# Patient Record
Sex: Male | Born: 1935 | Race: White | Hispanic: No | Smoking: Never smoker
Health system: Southern US, Community
[De-identification: ages and names within clinical notes are randomized; demographics above are authoritative.]

## PROBLEM LIST (undated history)

## (undated) DIAGNOSIS — G44209 Tension-type headache, unspecified, not intractable: Secondary | ICD-10-CM

## (undated) DIAGNOSIS — R634 Abnormal weight loss: Secondary | ICD-10-CM

## (undated) DIAGNOSIS — M25519 Pain in unspecified shoulder: Secondary | ICD-10-CM

## (undated) DIAGNOSIS — E785 Hyperlipidemia, unspecified: Secondary | ICD-10-CM

## (undated) DIAGNOSIS — H9190 Unspecified hearing loss, unspecified ear: Secondary | ICD-10-CM

## (undated) DIAGNOSIS — E550 Rickets, active: Secondary | ICD-10-CM

## (undated) DIAGNOSIS — G4700 Insomnia, unspecified: Secondary | ICD-10-CM

## (undated) DIAGNOSIS — I6529 Occlusion and stenosis of unspecified carotid artery: Secondary | ICD-10-CM

## (undated) DIAGNOSIS — R351 Nocturia: Secondary | ICD-10-CM

## (undated) DIAGNOSIS — I872 Venous insufficiency (chronic) (peripheral): Secondary | ICD-10-CM

## (undated) DIAGNOSIS — M1990 Unspecified osteoarthritis, unspecified site: Secondary | ICD-10-CM

## (undated) DIAGNOSIS — K449 Diaphragmatic hernia without obstruction or gangrene: Secondary | ICD-10-CM

## (undated) DIAGNOSIS — R609 Edema, unspecified: Secondary | ICD-10-CM

## (undated) DIAGNOSIS — M4120 Other idiopathic scoliosis, site unspecified: Secondary | ICD-10-CM

## (undated) DIAGNOSIS — G311 Senile degeneration of brain, not elsewhere classified: Secondary | ICD-10-CM

## (undated) DIAGNOSIS — J301 Allergic rhinitis due to pollen: Secondary | ICD-10-CM

## (undated) DIAGNOSIS — M810 Age-related osteoporosis without current pathological fracture: Secondary | ICD-10-CM

## (undated) DIAGNOSIS — G3101 Pick's disease: Secondary | ICD-10-CM

## (undated) DIAGNOSIS — E039 Hypothyroidism, unspecified: Secondary | ICD-10-CM

## (undated) DIAGNOSIS — K5900 Constipation, unspecified: Secondary | ICD-10-CM

## (undated) DIAGNOSIS — IMO0002 Reserved for concepts with insufficient information to code with codable children: Secondary | ICD-10-CM

## (undated) DIAGNOSIS — K5730 Diverticulosis of large intestine without perforation or abscess without bleeding: Secondary | ICD-10-CM

## (undated) DIAGNOSIS — R4701 Aphasia: Secondary | ICD-10-CM

## (undated) DIAGNOSIS — F0280 Dementia in other diseases classified elsewhere without behavioral disturbance: Secondary | ICD-10-CM

## (undated) DIAGNOSIS — N400 Enlarged prostate without lower urinary tract symptoms: Secondary | ICD-10-CM

## (undated) DIAGNOSIS — R569 Unspecified convulsions: Principal | ICD-10-CM

## (undated) DIAGNOSIS — K4090 Unilateral inguinal hernia, without obstruction or gangrene, not specified as recurrent: Secondary | ICD-10-CM

## (undated) DIAGNOSIS — F09 Unspecified mental disorder due to known physiological condition: Secondary | ICD-10-CM

## (undated) DIAGNOSIS — R413 Other amnesia: Secondary | ICD-10-CM

## (undated) DIAGNOSIS — F329 Major depressive disorder, single episode, unspecified: Secondary | ICD-10-CM

## (undated) DIAGNOSIS — I10 Essential (primary) hypertension: Secondary | ICD-10-CM

## (undated) HISTORY — DX: Pick's disease: G31.01

## (undated) HISTORY — DX: Unspecified hearing loss, unspecified ear: H91.90

## (undated) HISTORY — DX: Essential (primary) hypertension: I10

## (undated) HISTORY — DX: Venous insufficiency (chronic) (peripheral): I87.2

## (undated) HISTORY — DX: Rickets, active: E55.0

## (undated) HISTORY — DX: Aphasia: R47.01

## (undated) HISTORY — DX: Abnormal weight loss: R63.4

## (undated) HISTORY — DX: Unilateral inguinal hernia, without obstruction or gangrene, not specified as recurrent: K40.90

## (undated) HISTORY — DX: Hyperlipidemia, unspecified: E78.5

## (undated) HISTORY — DX: Other amnesia: R41.3

## (undated) HISTORY — DX: Other idiopathic scoliosis, site unspecified: M41.20

## (undated) HISTORY — DX: Reserved for concepts with insufficient information to code with codable children: IMO0002

## (undated) HISTORY — DX: Diverticulosis of large intestine without perforation or abscess without bleeding: K57.30

## (undated) HISTORY — DX: Unspecified convulsions: R56.9

## (undated) HISTORY — DX: Dementia in other diseases classified elsewhere without behavioral disturbance: F02.80

## (undated) HISTORY — DX: Nocturia: R35.1

## (undated) HISTORY — DX: Pain in unspecified shoulder: M25.519

## (undated) HISTORY — DX: Benign prostatic hyperplasia without lower urinary tract symptoms: N40.0

## (undated) HISTORY — DX: Insomnia, unspecified: G47.00

## (undated) HISTORY — DX: Constipation, unspecified: K59.00

## (undated) HISTORY — DX: Allergic rhinitis due to pollen: J30.1

## (undated) HISTORY — DX: Major depressive disorder, single episode, unspecified: F32.9

## (undated) HISTORY — DX: Hypothyroidism, unspecified: E03.9

## (undated) HISTORY — DX: Unspecified mental disorder due to known physiological condition: F09

## (undated) HISTORY — DX: Senile degeneration of brain, not elsewhere classified: G31.1

## (undated) HISTORY — DX: Occlusion and stenosis of unspecified carotid artery: I65.29

## (undated) HISTORY — DX: Age-related osteoporosis without current pathological fracture: M81.0

## (undated) HISTORY — DX: Edema, unspecified: R60.9

## (undated) HISTORY — DX: Unspecified osteoarthritis, unspecified site: M19.90

## (undated) HISTORY — DX: Diaphragmatic hernia without obstruction or gangrene: K44.9

## (undated) HISTORY — DX: Tension-type headache, unspecified, not intractable: G44.209

---
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
## 1999-09-21 ENCOUNTER — Encounter (INDEPENDENT_AMBULATORY_CARE_PROVIDER_SITE_OTHER): Payer: Self-pay | Admitting: Specialist

## 1999-09-21 ENCOUNTER — Other Ambulatory Visit: Admission: RE | Admit: 1999-09-21 | Discharge: 1999-09-21 | Payer: Self-pay | Admitting: Internal Medicine

## 1999-10-25 ENCOUNTER — Emergency Department (HOSPITAL_COMMUNITY): Admission: EM | Admit: 1999-10-25 | Discharge: 1999-10-25 | Payer: Self-pay | Admitting: Internal Medicine

## 2000-03-18 ENCOUNTER — Encounter: Admission: RE | Admit: 2000-03-18 | Discharge: 2000-03-18 | Payer: Self-pay | Admitting: Orthopedic Surgery

## 2000-03-18 ENCOUNTER — Encounter: Payer: Self-pay | Admitting: Orthopedic Surgery

## 2002-08-21 DIAGNOSIS — M810 Age-related osteoporosis without current pathological fracture: Secondary | ICD-10-CM

## 2002-08-21 HISTORY — DX: Age-related osteoporosis without current pathological fracture: M81.0

## 2002-12-30 DIAGNOSIS — N400 Enlarged prostate without lower urinary tract symptoms: Secondary | ICD-10-CM

## 2002-12-30 HISTORY — DX: Benign prostatic hyperplasia without lower urinary tract symptoms: N40.0

## 2003-12-22 DIAGNOSIS — M1990 Unspecified osteoarthritis, unspecified site: Secondary | ICD-10-CM

## 2003-12-22 DIAGNOSIS — K449 Diaphragmatic hernia without obstruction or gangrene: Secondary | ICD-10-CM

## 2003-12-22 DIAGNOSIS — R609 Edema, unspecified: Secondary | ICD-10-CM

## 2003-12-22 DIAGNOSIS — M4120 Other idiopathic scoliosis, site unspecified: Secondary | ICD-10-CM

## 2003-12-22 DIAGNOSIS — I10 Essential (primary) hypertension: Secondary | ICD-10-CM

## 2003-12-22 DIAGNOSIS — K4090 Unilateral inguinal hernia, without obstruction or gangrene, not specified as recurrent: Secondary | ICD-10-CM

## 2003-12-22 DIAGNOSIS — F3289 Other specified depressive episodes: Secondary | ICD-10-CM

## 2003-12-22 DIAGNOSIS — F329 Major depressive disorder, single episode, unspecified: Secondary | ICD-10-CM

## 2003-12-22 HISTORY — DX: Other specified depressive episodes: F32.89

## 2003-12-22 HISTORY — DX: Unilateral inguinal hernia, without obstruction or gangrene, not specified as recurrent: K40.90

## 2003-12-22 HISTORY — DX: Diaphragmatic hernia without obstruction or gangrene: K44.9

## 2003-12-22 HISTORY — DX: Unspecified osteoarthritis, unspecified site: M19.90

## 2003-12-22 HISTORY — DX: Major depressive disorder, single episode, unspecified: F32.9

## 2003-12-22 HISTORY — DX: Essential (primary) hypertension: I10

## 2003-12-22 HISTORY — DX: Edema, unspecified: R60.9

## 2003-12-22 HISTORY — DX: Other idiopathic scoliosis, site unspecified: M41.20

## 2004-01-05 DIAGNOSIS — J301 Allergic rhinitis due to pollen: Secondary | ICD-10-CM

## 2004-01-05 HISTORY — DX: Allergic rhinitis due to pollen: J30.1

## 2004-04-16 HISTORY — PX: COLONOSCOPY: SHX174

## 2004-05-31 ENCOUNTER — Ambulatory Visit: Payer: Self-pay | Admitting: Internal Medicine

## 2004-06-15 ENCOUNTER — Ambulatory Visit: Payer: Self-pay | Admitting: Internal Medicine

## 2004-06-19 ENCOUNTER — Ambulatory Visit (HOSPITAL_COMMUNITY): Admission: RE | Admit: 2004-06-19 | Discharge: 2004-06-19 | Payer: Self-pay | Admitting: Internal Medicine

## 2004-07-05 DIAGNOSIS — G4700 Insomnia, unspecified: Secondary | ICD-10-CM

## 2004-07-05 HISTORY — DX: Insomnia, unspecified: G47.00

## 2004-08-16 DIAGNOSIS — H9190 Unspecified hearing loss, unspecified ear: Secondary | ICD-10-CM | POA: Insufficient documentation

## 2004-08-16 HISTORY — DX: Unspecified hearing loss, unspecified ear: H91.90

## 2005-06-25 DIAGNOSIS — F0280 Dementia in other diseases classified elsewhere without behavioral disturbance: Secondary | ICD-10-CM

## 2005-06-25 DIAGNOSIS — R413 Other amnesia: Secondary | ICD-10-CM

## 2005-06-25 HISTORY — DX: Other amnesia: R41.3

## 2005-06-25 HISTORY — DX: Dementia in other diseases classified elsewhere, unspecified severity, without behavioral disturbance, psychotic disturbance, mood disturbance, and anxiety: F02.80

## 2005-07-15 HISTORY — PX: CAROTID ENDARTERECTOMY: SUR193

## 2005-07-16 DIAGNOSIS — I6529 Occlusion and stenosis of unspecified carotid artery: Secondary | ICD-10-CM

## 2005-07-16 HISTORY — DX: Occlusion and stenosis of unspecified carotid artery: I65.29

## 2005-07-19 ENCOUNTER — Inpatient Hospital Stay (HOSPITAL_COMMUNITY): Admission: AD | Admit: 2005-07-19 | Discharge: 2005-07-24 | Payer: Self-pay | Admitting: Cardiovascular Disease

## 2005-07-23 ENCOUNTER — Encounter (INDEPENDENT_AMBULATORY_CARE_PROVIDER_SITE_OTHER): Payer: Self-pay | Admitting: *Deleted

## 2005-07-25 DIAGNOSIS — G44209 Tension-type headache, unspecified, not intractable: Secondary | ICD-10-CM

## 2005-07-25 DIAGNOSIS — E785 Hyperlipidemia, unspecified: Secondary | ICD-10-CM

## 2005-07-25 HISTORY — DX: Hyperlipidemia, unspecified: E78.5

## 2005-07-25 HISTORY — DX: Tension-type headache, unspecified, not intractable: G44.209

## 2005-12-10 DIAGNOSIS — F09 Unspecified mental disorder due to known physiological condition: Secondary | ICD-10-CM

## 2005-12-10 DIAGNOSIS — K5900 Constipation, unspecified: Secondary | ICD-10-CM

## 2005-12-10 DIAGNOSIS — G311 Senile degeneration of brain, not elsewhere classified: Secondary | ICD-10-CM

## 2005-12-10 HISTORY — DX: Unspecified mental disorder due to known physiological condition: F09

## 2005-12-10 HISTORY — DX: Senile degeneration of brain, not elsewhere classified: G31.1

## 2005-12-10 HISTORY — DX: Constipation, unspecified: K59.00

## 2006-05-27 DIAGNOSIS — F0280 Dementia in other diseases classified elsewhere without behavioral disturbance: Secondary | ICD-10-CM

## 2006-05-27 DIAGNOSIS — R4701 Aphasia: Secondary | ICD-10-CM

## 2006-05-27 HISTORY — DX: Dementia in other diseases classified elsewhere, unspecified severity, without behavioral disturbance, psychotic disturbance, mood disturbance, and anxiety: F02.80

## 2006-05-27 HISTORY — DX: Aphasia: R47.01

## 2006-08-27 ENCOUNTER — Ambulatory Visit: Payer: Self-pay | Admitting: Vascular Surgery

## 2006-11-25 DIAGNOSIS — R351 Nocturia: Secondary | ICD-10-CM

## 2006-11-25 HISTORY — DX: Nocturia: R35.1

## 2006-12-06 ENCOUNTER — Ambulatory Visit (HOSPITAL_COMMUNITY): Admission: RE | Admit: 2006-12-06 | Discharge: 2006-12-07 | Payer: Self-pay | Admitting: General Surgery

## 2007-03-30 ENCOUNTER — Emergency Department (HOSPITAL_COMMUNITY): Admission: EM | Admit: 2007-03-30 | Discharge: 2007-03-30 | Payer: Self-pay | Admitting: Emergency Medicine

## 2007-08-26 ENCOUNTER — Ambulatory Visit: Payer: Self-pay | Admitting: Vascular Surgery

## 2007-09-13 ENCOUNTER — Emergency Department (HOSPITAL_COMMUNITY): Admission: EM | Admit: 2007-09-13 | Discharge: 2007-09-13 | Payer: Self-pay | Admitting: Emergency Medicine

## 2007-12-23 ENCOUNTER — Encounter: Admission: RE | Admit: 2007-12-23 | Discharge: 2008-01-15 | Payer: Self-pay | Admitting: Orthopedic Surgery

## 2008-04-05 DIAGNOSIS — E039 Hypothyroidism, unspecified: Secondary | ICD-10-CM

## 2008-04-05 DIAGNOSIS — E550 Rickets, active: Secondary | ICD-10-CM

## 2008-04-05 HISTORY — DX: Rickets, active: E55.0

## 2008-04-05 HISTORY — DX: Hypothyroidism, unspecified: E03.9

## 2008-09-01 DIAGNOSIS — I872 Venous insufficiency (chronic) (peripheral): Secondary | ICD-10-CM

## 2008-09-01 HISTORY — DX: Venous insufficiency (chronic) (peripheral): I87.2

## 2010-08-29 NOTE — Op Note (Signed)
NAMESAMARION, EHLE                  ACCOUNT NO.:  [id number]   MEDICAL RECORD NO.:  [id number]          PATIENT TYPE:  AMB   LOCATION:  DAY                          FACILITY:  [facility]   PHYSICIAN:  Sharlet Salina T. Hoxworth, M.D.DATE OF BIRTH:  [birth year]   DATE OF PROCEDURE:  12/06/2006  DATE OF DISCHARGE:                               OPERATIVE REPORT   PRE AND POSTOPERATIVE DIAGNOSIS:  Bilateral inguinal hernia.   SURGICAL PROCEDURES:  Laparoscopic repair bilateral inguinal hernia.   SURGEON:  Lorne Skeens. Hoxworth, M.D.   ANESTHESIA:  General.   BRIEF HISTORY:  Mr. Zechman is an 77 year old male with many year history  of initially asymptomatic bilateral inguinal hernias.  He however has  experienced enlargement and increasing discomfort recently and after  examination and discussion in the office, we have elected proceed with  laparoscopic repair of his bilateral inguinal hernias.  On examination  these appear to be large bilateral direct hernias.  The nature of the  procedures, indications, risks of bleeding, infection, urinary problems,  bowel, bladder injury, possible need for open procedure, recurrent  hernia been discussed understood.  He is now brought to operating room  for this procedure.   DESCRIPTION OF OPERATION:  The patient brought up and placed in supine  position on the operating table and general orotracheal anesthesia was  induced.  The abdomen, groins were widely sterilely prepped and draped  after placement of Foley catheter.  Preoperative antibiotics were given.  Correct patient and procedure were verified.  Local anesthesia was used  to infiltrate the trocar sites.  A 1 cm incision was made at the  umbilicus and dissection carried down to the midline fascia.  This was  sharply incised for 1 cm transversely at the medial edge of the right  rectus muscle retracted laterally and the preperitoneal space entered  under direct vision.  The balloon dissector was then  passed with its  tip down to the pubic symphysis and inflated.  There was very good  dissection on the right but only partial deployment of the balloon on  the left.  It was left for several minutes for hemostasis then removed  and the balloon trocar placed and CO2 pressure applied.  The right side  was initially dissected.  The pubic symphysis identified and Cooper's  ligament was cleared down to the iliac vessels which were carefully  identified and protected.  There was chronically incarcerated  preperitoneal fat up into a large direct hernia just above the Cooper's  ligament and medial to the epigastric vessels.  All this was completely  dissected out completely freeing the pseudo sac and clearing this down  to Cooper's ligament.  The edge of the defect were all well-defined.  There was good lateral dissection of the peritoneum off the anterior  abdominal wall which was continued out a little bit further laterally to  the anterior-superior iliac spine and up to the level of the umbilicus.  Peritoneal edge was then followed back medially.  Cord structures were  identified.  There was no indirect component.  Following this the  left  side was dissected.  Again there had not been much dissection of the  peritoneum off the anterior abdominal wall.  However Cooper's ligament  had been dissected.  This was cleared again down to the iliac vessels  which were carefully protected.  The gastric vessels were noted and then  just medial to this again was a very large direct defect which contained  a lot of preperitoneal fat that was dissected down out of the defect  completely clearing the entire pseudo sac and the edges of the defect.  The peritoneum was then bluntly dissected down off the anterior  abdominal wall working out laterally until it was down out to the  anterior superior iliac spine again.  The edge was followed back  medially.  Cord structures were identified and again no indirect  hernia  seen on the left.  Following this a large Bard right-sided piece of mesh  was placed in the preperitoneal space and deployed and tacked with its  medial corner initially at the pubic symphysis and then the inferior  edge down along Cooper's ligament avoiding the iliac vessels.  Laterally  the lateral corner of the mesh was placed well out toward the anterior-  superior iliac spine and beginning along its anterior edge where we  could feel each tack through the anterior abdominal wall.  The superior  edge of the mesh was tacked and then the medial edge.  This provided  nice broad coverage of the direct and indirect spaces.  Following this a  large left-sided piece of Bard mesh was deployed on the left side and  tacked in identical fashion again with nice coverage.  The operative  site inspected for hemostasis.  All CO2 was evacuated while holding the  lateral corners of mesh in place.  At this point there was noted to be a  moderate pneumoperitoneum was evacuated with a Veress needle in the left  upper quadrant without difficulty.  Skin incisions were closed with  interrupted subcuticular 4-0 Monocryl and Dermabond.  The patient taken  to recovery in good condition.      Lorne Skeens. Hoxworth, M.D.  Electronically Signed     BTH/MEDQ  D:  12/06/2006  T:  12/07/2006  Job:  [id number]

## 2010-08-29 NOTE — Discharge Summary (Signed)
NAMEARVIN, [name]                  ACCOUNT NO.:  [id number]   MEDICAL RECORD NO.:  [id number]          PATIENT TYPE:  OIB   LOCATION:  1531                         FACILITY:  [facility]   PHYSICIAN:  Sharlet Salina T. Hoxworth, M.D.DATE OF BIRTH:  [birth year]   DATE OF ADMISSION:  12/06/2006  DATE OF DISCHARGE:  12/07/2006                               DISCHARGE SUMMARY   DISCHARGE DIAGNOSIS:  Bilateral inguinal hernias   SURGICAL PROCEDURE:  Laparoscopic repair of bilateral inguinal hernias,  Dr. Johna Sheriff on August 22.   HISTORY OF PRESENT ILLNESS:  [name] is an 77 year old male who  presents with a long history of gradually enlarging bilateral inguinal  hernias which now have become symptomatic, occasionally painful, and  have at least on one occasional, been reduced with some difficulty.  We  have therefore elected to proceed with laparoscopic bilateral repair,  and he is brought to the hospital for this procedure.   PAST MEDICAL HISTORY:  1. He is treated for very mild dementia.  2. BPH.  3. Hypertension.  4. Elevated cholesterol.  5. Arthritis.   MEDICATIONS:  1. Aricept 10 mg at h.s.  2. Aspirin 81 mg daily.  3. Avodart 0.5 daily.  4. Multivitamin.  5. Namenda 10 mg q.h.s.  6. Vasotec 5 mg q.h.s.  7. Zocor 40 mg daily.  8. Anaprox 275 q.8h. p.r.n.  9. Ativan 0.25 p.o. q.h.s. p.r.n.  10.Tylenol p.r.n.   REVIEW OF SYSTEMS:  See H&P.   PERTINENT EXAM:  Significant for thin, elderly, alert, white male in no  acute distress.  There were large, reducible probably direct bilateral inguinal hernias.   HOSPITAL COURSE:  The patient underwent uneventful laparoscopic repair  on August 22 of bilateral inguinal hernias.  He tolerated the procedure  well.  A Foley catheter was left in overnight due to his chronic urinary  symptoms.  This was removed the following morning, and he was able to  void.  His abdomen was soft and  nontender, wounds, healing well.  Pain was well controlled,  and he is  tolerating a diet.  He is discharged back to [facility] to the rehab  unit temporarily for recovery.  Followup will be in my office in 2  weeks.   DISCHARGE MEDICATIONS:  Same as prior to admission plus Vicodin as  needed for pain.      Lorne Skeens. Hoxworth, M.D.  Electronically Signed     BTH/MEDQ  D:  12/07/2006  T:  12/07/2006  Job:  [id number]

## 2010-08-29 NOTE — Procedures (Signed)
CAROTID DUPLEX EXAM   INDICATION:  Followup, left carotid endarterectomy.   HISTORY:  Diabetes:  No.  Cardiac:  No.  Hypertension:  No.  Smoking:  No.  Previous Surgery:  Left carotid endarterectomy.  CV History:  Amaurosis Fugax No, Paresthesias No, Hemiparesis No                                       RIGHT             LEFT  Brachial systolic pressure:         124               120  Brachial Doppler waveforms:         Biphasic          Biphasic  Vertebral direction of flow:        Antegrade         Antegrade  DUPLEX VELOCITIES (cm/sec)  CCA peak systolic                   76                58  ECA peak systolic                   117               76  ICA peak systolic                   90                72  ICA end diastolic                   27                19  PLAQUE MORPHOLOGY:                  Calcified         None  PLAQUE AMOUNT:                      Mild              None  PLAQUE LOCATION:                    ICA               None   IMPRESSION:  1. 1-39% stenosis noted in the right internal carotid artery.  2. Normal carotid duplex noted in the left internal carotid artery,      status post left carotid endarterectomy.  3. Antegrade bilateral vertebral arteries.   ___________________________________________  Quita Skye Hart Rochester, M.D.   MG/MEDQ  D:  08/26/2007  T:  08/26/2007  Job:  [id number]

## 2010-09-01 NOTE — Discharge Summary (Signed)
[name], [name]                  ACCOUNT NO.:  [id number]   MEDICAL RECORD NO.:  [id number]          PATIENT TYPE:  INP   LOCATION:  3304                         FACILITY:  [facility]   PHYSICIAN:  Richard A. Alanda Amass, M.D.DATE OF BIRTH:  08/14/1922   DATE OF ADMISSION:  07/19/2005  DATE OF DISCHARGE:  07/24/2005                                 DISCHARGE SUMMARY   PRIMARY DIAGNOSIS:  Severe left internal carotid stenosis with episodes of  transient visual loss, left eye.   IN-HOSPITAL OPERATIONS/PROCEDURES:  1.  Retrograde abdominal aortic catheterization.  2.  Aortic arch angiogram.  3.  Subselective right vertebral and right common carotid angiography.  4.  Selective left subclavian angiography.  5.  Subselective left vertebral angiogram.  6.  Left carotid angiography.  7.  Left carotid endarterectomy with Dacron patch angioplasty.   PATIENT'S HISTORY AND PHYSICAL AND HOSPITAL COURSE:  The patient is an 77-  year-old healthy patient who has had two episodes of partial loss of vision,  left eye, over the past year, one occurring one year ago and one occurring 3-  4 weeks ago.  Each of these episodes lasted from 3-10 minutes.  He always  completely lost vision, left eye, but it completely returned.  He mentioned  this to an ophthalmologist one year ago and recently following this episode,  an MRI study was obtained which revealed no evidence of acute stroke, only  some cerebral atrophy.  Carotid duplex examination on March 30 revealed a  severe left internal carotid stenosis, approximately 95%, cerebral angiogram  which was done by Dr. Alanda Amass confirmed a 95-99% left internal carotid  stenosis at the origin of the left internal carotid artery.  The patient has  had no previous hemispheric or non-hemispheric TIAs other than episodes of  amaurosis fugax and has no previous history of stroke.   The patient was admitted under Dr. Alanda Amass on July 19, 2005.  Following  cerebral  angiogram, Dr. Hart Rochester was consulted.  Dr. Hart Rochester saw and evaluated  the patient.  He discussed with the patient undergoing left carotid  endarterectomy due to the high grade stenosis seen.  Risks and benefits were  discussed with the patient.  The patient acknowledges understanding and  agreed to proceed.  Surgery was scheduled for Monday, July 23, 2005.  Over  the weekend, the patient remained asymptomatic and stable.   The patient was taken to the operating room July 23, 2005 where he underwent  left carotid endarterectomy with Dacron patch angioplasty.  The patient  tolerated this procedure well and was transferred to the intensive care unit  in stable condition.  Following surgery, the patient was admitted to the  intensive care unit.  He was seen to be alert and oriented x 3, neuro  intact.  The patient's postoperative course was pretty much unremarkable.  Postop day 1, he remained alert and oriented x 3 and neuro remained intact.  He was out of bed ambulating well.  Foley was discontinued and the patient  is voiding without difficulty.  The patient's vital were monitored and seen  to be stable.  He was saturating 100% on room air prior to discharge.  The  patient was afebrile postoperatively.  His incision was dry and intact and  healing well.  No hematoma noted.  The patient was tolerating regular diet  well, no nausea or vomiting noted.  No difficulty swallowing noted.  He  remained in normal sinus rhythm postoperatively.  The patient remained  hemodynamically stable postoperatively with hemoglobin and hematocrit being  12.2 and 34.9 postop day 1.  Neurology did follow postop day 1 and felt that  the patient was stable from their standpoint to be discharged.   The patient was discharged to [facility] today, postop day  1, April 10. 2007.  A follow-up appointment was arranged with Dr. Hart Rochester for  Aug 14, 2005 at 11:40 a.m.  The patient is to follow up with Dr.  Alanda Amass on  Aug 14, 2005 at 3:45 p.m.  [name] received instructions on diet, activity  level, incisional care.  He was told no driving until released until to do  so.  No heavy lifting over 10 pounds.  The patient was told he is allowed to  shower, washing his incisions using soap and water.  He is to contact the  office should he develop any drainage or opening from any of his incisions.  The patient was told to ambulate 3-4 times per day, progress as tolerated.  He was told to continue his previous exercises.  The patient was educated on  diet to be low fat, low salt.  He acknowledges understanding.   DISCHARGE MEDICATIONS:  1.  Darvocet-N 100 one to two tabs q.4-6h. p.r.n. pain.  2.  Aspirin 325 mg daily.  3.  Proscar 5 mg daily.  4.  Temazepam 15 mg p.r.n.  5.  Avelox 400 mg daily x 10 days.  6.  Zocor 40 mg daily.      Theda Belfast, PA      Richard A. Alanda Amass, M.D.  Electronically Signed    KMD/MEDQ  D:  07/24/2005  T:  07/24/2005  Job:  [id number]   cc:   Quita Skye. Hart Rochester, M.D.  [address]  [place]  [place] [address]

## 2010-09-01 NOTE — Discharge Summary (Signed)
[name], [name]                  ACCOUNT NO.:  [id number]   MEDICAL RECORD NO.:  [id number]          PATIENT TYPE:  INP   LOCATION:  3304                         FACILITY:  [facility]   PHYSICIAN:  Richard A. Alanda Amass, M.D.DATE OF BIRTH:  13-Dec-1922   DATE OF ADMISSION:  07/19/2005  DATE OF DISCHARGE:  07/24/2005                                 DISCHARGE SUMMARY   [name] is an 77 year old white widowed male patient who was seen by  Gerlene Burdock A. Alanda Amass, M.D. in the office on July 18, 2005.  He had problems  and he was referred by Lenon Curt. Chilton Si, M.D. secondary to episodes of  amaurosis fugax of the left eye.  He had it several times about 1 year ago  and then about 3 weeks ago. He had Dopplers which showed high grade stenosis  of the left internal carotid artery.  It was decided by Dr. Alanda Amass that  he should undergo carotid and cerebral angiogram.  This was done on July 19, 2005.  It revealed eccentric 95% left internal carotid artery stenosis.  He  had no right ICA stenosis.  He then underwent vascular surgery consultation.  He was seen by Quita Skye. Hart Rochester, M.D.  It was decided by Dr. Hart Rochester that he  should stay in the hospital with IV heparin and undergo carotid  endarterectomy next available schedule day.  Thus on July 23, 2005, he  underwent left CEA.  He was seen by Dr. Hart Rochester on the morning of July 24, 2005, post surgery.  He had no complaints.  He had a slight deviation of the  tongue to the right.  He had 5/5 motor strength upper and lower extremities.  It was felt that his tongue deviation would resolve.  It was only mild.  It  was decided that he could be discharged home if we agreed.  He was seen by  Dr. Allyson Sabal and thought to be stable to be discharged home to follow up as an  outpatient.  His blood pressure on the day of discharge was 122/46, pulse  was 68, and respirations were 22.  O2 saturations were 97%.  His hemoglobin  was 12.2, hematocrit of 34, WBC 9.3,  platelets 225.  Sodium 139, potassium  3.9, BUN 7, creatinine 0.6.   LABORATORY DATA:  PSA was 1.42.  Homocysteine was 9.  Total cholesterol was  153, triglycerides 104, HDL 33, LDL 99.  Urine was negative for any  bacteria.  His chest x-ray showed no active disease.   EKG showed right bundle branch block.   DISCHARGE DIAGNOSES:  1.  High grade left internal carotid artery stenosis by carotid angiogram      with 95% stenosis.  2.  Symptoms of amaurosis fugax, recurrent.  3.  Status post left carotid endarterectomy by Dr. Hart Rochester on July 23, 2005.  4.  Right bundle branch block.  5.  Mild organic brain syndrome.  6.  Arthritis.  7.  Hyperlipidemia.   DISCHARGE MEDICATIONS:  1.  Aspirin 325 mg every day.  2.  Proscar 5 mg  every day.  3.  Temazepam 15 mg as needed.  4.  Avelox 400 mg every day x10 days.  5.  Zocor 40 mg every day at bedtime.   FOLLOW UP:  He will follow up with Dr. Hart Rochester on Aug 14, 2005, at 11:40 a.m.  He will follow up with Dr. Alanda Amass on Aug 14, 2005, at 3:45 p.m.  He will  call Dr. Candie Chroman office with any problems with his wound such as redness,  drainage, or temperature greater than 101.5.  He should clean his wound with  mild soap and water starting on July 25, 2005, and he can take Darvocet-N  100 every 4 hours p.r.n. as needed for pain.      Lezlie Octave, N.P.      Richard A. Alanda Amass, M.D.  Electronically Signed    BB/MEDQ  D:  07/24/2005  T:  07/24/2005  Job:  [id number]   cc:   Quita Skye. Hart Rochester, M.D.  [address]  [place]  [place] [address]   Lenon Curt. Chilton Si, M.D.  Fax: [phone]

## 2010-09-01 NOTE — Op Note (Signed)
[name], [name]                  ACCOUNT NO.:  [id number]   MEDICAL RECORD NO.:  [id number]          PATIENT TYPE:  INP   LOCATION:  2550                         FACILITY:  [facility]   PHYSICIAN:  Quita Skye. Hart Rochester, M.D.  DATE OF BIRTH:  07/06/1922   DATE OF PROCEDURE:  07/23/2005  DATE OF DISCHARGE:                                 OPERATIVE REPORT   PREOP DIAGNOSIS:  Severe left internal carotid stenosis with amaurosis  fugax, left eye.   POSTOP DIAGNOSIS:  Severe left internal carotid stenosis with amaurosis  fugax, left eye.   OPERATIONS:  Left carotid endarterectomy Dacron patch angioplasty.   SURGEON:  Dr. Hart Rochester   ASSISTANT:  Charlesetta Garibaldi.   ANESTHESIA:  General endotracheal.   BRIEF HISTORY:  This patient is an 77 year old who suffered one episode of  amaurosis fugax 12 months ago with loss of vision in the left eye lasting 15  minutes.  Following this, he was asymptomatic until 4 weeks ago when he had  a second episode.  He was found to have severe left internal carotid  stenosis by duplex scanning confirmed by angiography, was scheduled for left  carotid endarterectomy.   PROCEDURE:  the patient was taken to the operating room, placed in supine  position at which time satisfactory general endotracheal anesthesia was  administered.  Left neck was prepped Betadine scrub solution, draped routine  sterile manner.  Incision was made along the anterior border  sternocleidomastoid muscle and carried down through subcutaneous tissue and  platysma using Bovie.  Common facial vein external jugular veins ligated  with 3-0 silk ties, divided exposing the common, internal, external carotid  arteries.  Care was taken not to injure the vagus or hypoglossal nerves both  which were exposed.  There was calcified atherosclerotic plaque at carotid  bifurcation extending up the internal carotid artery about 4 to 5 cm.  Distal vessel appeared normal.  #10 shunt was prepared.  The patient  was  heparinized.  Carotid vessels were occluded with vascular clamps.  Longitudinal opening made in the common carotid with 15 blade extended up  the internal carotid with Potts scissors to a point distal to the disease.  #10 shunt was inserted without difficulty reestablishing flow in about 2  minutes.  Standard endarterectomy was then performed using elevator and  Potts scissors with eversion endarterectomy the external carotid.  The  plaque feathered off the distal internal carotid artery nicely not requiring  any tacking sutures.  The lumen was thoroughly irrigated heparin saline.  All loose debris carefully removed and arteriotomy was closed with a patch  using continuous 6-0 Prolene.  Prior to completion of closure the shunt was  removed after 30 minutes shunt time following antegrade and retrograde  flushing the closure was completed, reestablishing flow initially up the  external and internal branch.  Carotid was occluded for less than 2 minutes for removal of shunt.  Protamine was given to reverse the heparin.  Following that hemostasis wound  was irrigated with saline, closed in layers with Vicryl subcuticular  fashion.  Sterile dressing applied.  The patient taken to recovery in  satisfactory condition.           ______________________________  Quita Skye Hart Rochester, M.D.     JDL/MEDQ  D:  07/23/2005  T:  07/23/2005  Job:  [id number]

## 2010-09-01 NOTE — Consult Note (Signed)
[name], GRATER NO.:  [id number]   MEDICAL RECORD NO.:  [id number]          PATIENT TYPE:  INP   LOCATION:  3701                         FACILITY:  [facility]   PHYSICIAN:  Quita Skye. Hart Rochester, M.D.  DATE OF BIRTH:  10-18-1922   DATE OF CONSULTATION:  07/19/2005  DATE OF DISCHARGE:                                   CONSULTATION   CHIEF COMPLAINT:  Severe left internal carotid stenosis with episodes of  transient visual loss left eye.   HISTORY OF PRESENT ILLNESS:  This 77 year old healthy patient has had two  episodes of partial loss of vision in the left eye over the past year, one  occurring one year ago and one occurring three to four weeks ago each  lasting from three to 10 minutes.  He always completely lost vision in the  left eye but it completely returned.  He mentioned this to an  ophthalmologist one year ago and recently following this episode an MRI  study was obtained which revealed no evidence of acute stroke, but only some  cerebral atrophy.  Carotid duplex examination on March 30 revealed a severe  left internal carotid stenosis approximating 95% and cerebral angiogram  today by Dr. Alanda Amass confirmed a 95-99% left internal carotid stenosis at  the origin of the left internal carotid artery.  He has had no previous  hemispheric or non-hemispheric TIAs other than the episodes of amaurosis  fugax and has no previous history of stroke.   PAST MEDICAL HISTORY:  Negative for diabetes, coronary artery disease, COPD,  hypertension, or stroke.   PAST SURGICAL HISTORY:  Tonsillectomy at age 33.   ALLERGIES:  PENICILLIN.   MEDICATIONS:  1.  Proscar 5.  2.  Temazepam 15 mg h.s.   SOCIAL HISTORY:  The patient does not use tobacco or alcohol.  He resides at  [facility].  He has a son in [place] and a son in [place] and his  wife has died in the past few years.   PHYSICAL EXAMINATION:  VITAL SIGNS:  Blood pressure 140/80, heart rate is  80,  respirations are 14.  GENERAL:  He is a healthy-appearing elderly male in no apparent distress.  He is alert and oriented x3.  NECK:  Supple with 3+ carotid pulses.  No bruits are audible.  NEUROLOGIC:  Normal.  No palpable adenopathy in the neck.  Upper extremity  pulses are 3+ bilaterally.  CHEST:  Clear to auscultation.  CARDIOVASCULAR:  Regular rhythm.  No murmurs.  ABDOMEN:  Soft, nontender with no palpable mass.  EXTREMITIES:  He has 3+ femoral and distal pulses bilaterally with well-  perfused lower extremities.   Cerebral angiograms were reviewed with Dr. Alanda Amass and I agree that he  does have a very tight left internal carotid stenosis approximating 95-99%  with a cross-filling from the right to the left in the intracerebral  vessels.   IMPRESSION:  Severe 99% left internal carotid stenosis with amaurosis fugax  three to four weeks ago, but no further symptoms.   RECOMMENDATIONS:  Would admit the patient for IV  heparin therapy and proceed  with left carotid endarterectomy on Monday, April 9 unless he has further  symptoms in the interim.  We will follow him closely in the hospital.  I  have discussed the surgery with the patient and his son and both are in  agreement to proceed with this plan.           ______________________________  Quita Skye. Hart Rochester, M.D.     JDL/MEDQ  D:  07/19/2005  T:  07/20/2005  Job:  [id number]   cc:   Lenon Curt. Chilton Si, M.D.  Fax: [phone]

## 2010-09-01 NOTE — Op Note (Signed)
NAMEJAXAN, [name]                  ACCOUNT NO.:  [id number]   MEDICAL RECORD NO.:  [id number]          PATIENT TYPE:  AMB   LOCATION:  SDS                          FACILITY:  [facility]   PHYSICIAN:  Richard A. Alanda Amass, M.D.DATE OF BIRTH:  1922/10/19   DATE OF PROCEDURE:  07/19/2005  DATE OF DISCHARGE:                                 OPERATIVE REPORT   PROCEDURE:  Retrograde abdominal aortic catheterization, abdominal aortic  angiogram midstream PA projection using DSA, aortic arch retrograde central  aortic catheterization, aortic arch angiogram shallow LAO projection using  DSA, subselective right vertebral and right common carotid angiography,  selective left subclavian angiography, subselective left vertebral angiogram  PA and lateral cervical intracranial projections, left carotid angiography  PA and lateral, cervical and intracranial injections using DSA imaging.   COMPLICATIONS:  None.   PRIMARY OPERATOR:  Richard A. Alanda Amass, M.D.   PROCTOR:  Dr. Runell Gess, MD.   ACCESS SITE:  Common right femoral artery percutaneous, using modified  Seldinger technique with 18 thin-wall needle, 5-French venous side arm  sheath.   PROCEDURE:  The patient was brought to the second floor PV lab in a post  absorptive state after 5 mg Valium p.o. premedication.  The right groin is  prepped, draped in usual manner, 1% Xylocaine was used for local anesthesia.  This the CRFA was entered with single anterior puncture using 18 thin-wall  needle.  A Wholey wire was used for catheter exchanges throughout procedure.  There was leaking around the 5-French sheath so this was upgraded to a 6-  [place] sheath without difficulty.  Abdominal aortic angiography was done in  the midstream PA projection at 20 mL, 20 mL per second using DSA.  The  pigtail catheter was then advanced to the aortic arch and aortic arch  angiogram was done in the LAO projection at 25 mL, 20 mL per second using  DSA.   Arterial pressures were monitored throughout the procedure and ranged  160 to 170 mmHg systolic. He was in sinus rhythm with underlying right  bundle branch block which was stable.  No intra procedural sedation was  required or administered.   Abdominal aortic angiogram demonstrated mild to moderate infrarenal calcific  atherosclerotic disease with shell-like calcification no aneurysm or  stenosis.  The iliacs were markedly tortuous bilaterally with no significant  stenosis.  The hypogastrics were not well visualized on the single  injection, but there appeared to be good runoff.  There is about 20-30%  narrowing eccentrically of the right common iliac but good residual lumen.  The SFA and celiac axis were intact and the IMA was intact.  Both renal  arteries were single and there was 30% narrowing of the left renal artery in  its proximal portion and no significant narrowing of the right renal artery.   Aortic arch angiogram in the LAO projection showed a markedly uncoiled but a  horizontal aortic arch.  There was normal origin of the right  brachiocephalic, the left common carotid and the left subclavian with no  significant stenosis.  There was  no significant calcification of the arch  seen and there was no aneurysm or dissection.   The catheter was exchanged for a JP1 end hole 5-French catheter.  Selective  right brachiocephalic injections were performed revealing severely tortuous  right brachiocephalic artery but no significant stenosis.  The right  subclavian proximal and distal to the right vertebral was widely patent with  no significant stenosis.  The RIMA was intact as was the right  thyrocervical.  The right vertebral showed antegrade flow.   Attempts to access the RCCA selectively were hampered by a marked tortuosity  of the common carotid.  Initially a Wholey wire was used and then a 0.035  inches Glidewire however, we were not able to safely advance the catheter  because  of the marked bend so subselective right carotid angiography was  then formed in the PA, oblique and lateral projections.  Both with cervical  and intracranial views.  Catheter was then pulled back and left subclavian  angiography and subselective left vertebral was done in the PA view and then  cervical left vertebral was done in the PA and lateral and PA and lateral  intracranial injections were performed using DSA.  The catheter was then  pulled back and the left common carotid was selectively entered without  difficulty.  The cervical and intracerebral left carotid angiography was  performed in the PA lateral and oblique views using hand injections.  Catheters removed, side-arm sheath was flushed.  The patient tolerated the  diagnostic procedure well.  Side-arm sheath and attempt at using a 6-French  Star close was not successful since the nitinol clip did not deploy.  It was  easily removed and pressure hemostasis was applied in the laboratory with  good right lower extremity pulses.  The patient tolerated procedure well and  was transferred to the holding area for postoperative care in stable  condition.   The right common carotid was markedly tortuous with no significant stenosis.   The proximal right internal carotid showed mild dilatation of the carotid  bulb but there was no significant stenosis.  The remainder of the cervical  carotid was tortuous but smooth with no significant stenosis.   The RECA appeared normal with normal branches.   Intracranial views of the right carotid artery demonstrated cross filling  through the anterior communicating artery to the left middle cerebral  artery.  The cavernous portion of the right internal carotid artery was  markedly tortuous but there was no significant stenosis or aneurysm  formation.  Both anterior cerebral arteries filled by subselective right  carotid injection and as mentioned there was filling of the left middle cerebral  via the anterior circulation with good filling of the left middle  cerebral and competitive filling to the distal left intracranial carotid.  The middle cerebral artery and branches appeared normal on the right and  extended to the outer cranium and venous return was normal.  The cervical  right vertebral appeared normal and there was faint filling of the basilar  artery on the subselective injection.   The left subclavian had mild atherosclerotic disease with no significant  stenosis and there was about 30% narrowing of its midportion before the  origin of the left vertebral.  The left vertebral was large tortuous and the  cervical extraosseous foraminal segments with no significant stenosis.  The  LIMA was intact.  The left thyrocervical was intact.  The basilar artery  filled well had no significant stenosis.  There was predominant  filling of  the right posterior cerebral on subselective left vertebral injection.  This  appeared to be the dominant vessel and there was only faint filling of the  left posterior cerebral vessels.  The cerebellar arteries appeared intact.  There was no stenosis or aneurysm formation.   Left common carotid was markedly ectatic and tortuous but smooth throughout  its course.  The proximal portion of the LICA there was a eccentric thread-  like 95% stenosis.  No thrombus was visualized and there was no significant  calcification.  There was some antegrade filling of beyond the stenosis and  intracerebral filling which was competitive.   The LECA appeared normal and had normal branches.  The left internal carotid  was seen through the cavernous segment which was tortuous but there was no  significant stenosis or aneurysm formation.  There appeared to be filling of  a left posterior communicating branch with filling of the left posterior  cerebral branch.  There was competitive flow beyond this to the left middle  cerebral and anterior cerebral arteries.    BRIEF HISTORY:  This 17 year old widowed father of two with seven  grandchildren and great grandchildren has had no significant health problems  and he has been active and ambulatory and lives at [facility].  He had an  episode of amaurosis fugax approximately 1 year ago that lasted 15 to 30  seconds of the left eye and a similar episode 2 to 3 weeks ago that lasted  for 30 seconds.  He was referred through the courtesy of Dr. Frederik Pear.  Outpatient MRI was performed and demonstrated mild cerebral atrophy but no  infarct or significant abnormalities.  He was referred for 2-D echo with  Doppler.  The 2-D echo revealed mild diastolic dysfunction, normal systolic  function and no significant and EF greater than 55%.  No significant  valvular disease.   Carotid Dopplers revealed severe proximal LICA stenosis with systolic  velocity of 404 diastolic 150 centimeters per second and now ICA/CCA ratio  of 8.1.  This and B-mode imaging revealed severe stenosis of approximately  95%.  Cerebral angiography confirms high-grade left internal carotid stenosis.  The common carotids are markedly tortuous.  There is an uncoiled aorta which  is horizontal.  The patient does not have any history of coronary disease  and no history of angina.  He has mild to moderate hypertension.  He was  started on aspirin when seen in the office yesterday.  He is not a candidate  for carotid stenting because of the marked tortuosity of his common carotid  artery and distal left internal.  He will be started continued on aspirin  now, statin therapy and empiric ACE inhibitors pending further laboratory  evaluation.  He will be seen by Dr. Jerilee Field for consultation for  purposes of carotid endarterectomy with further recommendations pending.   CATHETERIZATION DIAGNOSIS:  1.  Symptomatic high-grade left internal carotid stenosis.  2.  95% LICA stenosis without visible thrombus, markedly tortuous LCCA      without  significant stenosis.  3.  Normal right carotid system.  Cross filling of the intracerebral left      middle cerebral artery and left anterior cerebral artery from a right-      sided injection as outlined above.  4.  No significant vertebral artery or basilar artery stenosis noted.  5.  Asymptomatic right bundle branch block, right axis deviation.  6.  Systemic hypertension.  7.  BPH  on Proscar.  8.  Possible hyperlipidemia.  9.  Nonsmoker.      Richard A. Alanda Amass, M.D.  Electronically Signed     RAW/MEDQ  D:  07/19/2005  T:  07/20/2005  Job:  [id number]   cc:   Lenon Curt. Chilton Si, M.D.  Fax: [phone]   CP lab   PV lab   Dr. Kandis Cocking PV lab   Dr. Kandis Cocking Doppler lab   Quita Skye. Hart Rochester, M.D.  [address]  [place]  [place] [address]   Nanetta Batty, M.D.  Fax: [phone]

## 2010-12-11 DIAGNOSIS — IMO0002 Reserved for concepts with insufficient information to code with codable children: Secondary | ICD-10-CM

## 2010-12-11 HISTORY — DX: Reserved for concepts with insufficient information to code with codable children: IMO0002

## 2010-12-31 ENCOUNTER — Emergency Department (HOSPITAL_COMMUNITY)
Admission: EM | Admit: 2010-12-31 | Discharge: 2010-12-31 | Disposition: A | Discharge status: Home / Self Care | Payer: Medicare Other | Attending: Emergency Medicine | Admitting: Emergency Medicine

## 2010-12-31 ENCOUNTER — Emergency Department (HOSPITAL_COMMUNITY): Payer: Medicare Other

## 2010-12-31 ENCOUNTER — Inpatient Hospital Stay (INDEPENDENT_AMBULATORY_CARE_PROVIDER_SITE_OTHER)
Admission: RE | Admit: 2010-12-31 | Discharge: 2010-12-31 | Disposition: A | Discharge status: Home / Self Care | Payer: Medicare Other | Source: Ambulatory Visit | Attending: Emergency Medicine | Admitting: Emergency Medicine

## 2010-12-31 DIAGNOSIS — Z79899 Other long term (current) drug therapy: Secondary | ICD-10-CM | POA: Insufficient documentation

## 2010-12-31 DIAGNOSIS — R079 Chest pain, unspecified: Secondary | ICD-10-CM

## 2010-12-31 DIAGNOSIS — E7800 Pure hypercholesterolemia, unspecified: Secondary | ICD-10-CM | POA: Insufficient documentation

## 2010-12-31 DIAGNOSIS — Z87891 Personal history of nicotine dependence: Secondary | ICD-10-CM | POA: Insufficient documentation

## 2010-12-31 DIAGNOSIS — I10 Essential (primary) hypertension: Secondary | ICD-10-CM | POA: Insufficient documentation

## 2010-12-31 DIAGNOSIS — R071 Chest pain on breathing: Secondary | ICD-10-CM | POA: Insufficient documentation

## 2010-12-31 DIAGNOSIS — N400 Enlarged prostate without lower urinary tract symptoms: Secondary | ICD-10-CM | POA: Insufficient documentation

## 2010-12-31 LAB — POCT I-STAT, CHEM 8
Chloride: 103 mEq/L (ref 96–112)
HCT: 41 % (ref 39.0–52.0)
Potassium: 3.9 mEq/L (ref 3.5–5.1)
Sodium: 141 mEq/L (ref 135–145)

## 2010-12-31 LAB — GLUCOSE, CAPILLARY: Glucose-Capillary: 95 mg/dL (ref 70–99)

## 2010-12-31 LAB — POCT I-STAT TROPONIN I: Troponin i, poc: 0 ng/mL (ref 0.00–0.08)

## 2011-01-22 LAB — DIFFERENTIAL
Basophils Absolute: 0
Eosinophils Relative: 2
Lymphocytes Relative: 34
Lymphs Abs: 3.4
Neutro Abs: 5.5

## 2011-01-22 LAB — COMPREHENSIVE METABOLIC PANEL
AST: 19
BUN: 18
CO2: 28
Calcium: 8.8
Creatinine, Ser: 0.89
GFR calc Af Amer: >60
GFR calc non Af Amer: >60
Total Bilirubin: 1.3 — ABNORMAL HIGH

## 2011-01-22 LAB — URINALYSIS, ROUTINE W REFLEX MICROSCOPIC
Glucose, UA: NEGATIVE
Ketones, ur: NEGATIVE
Nitrite: NEGATIVE
Specific Gravity, Urine: 1.012
pH: 7.5

## 2011-01-22 LAB — URINE CULTURE
Colony Count: NO GROWTH
Culture: NO GROWTH

## 2011-01-22 LAB — CBC
HCT: 43.5
MCHC: 33.8
MCV: 91.5
Platelets: 265

## 2011-01-26 LAB — BASIC METABOLIC PANEL
CO2: 31
Calcium: 9.2
Chloride: 106
Creatinine, Ser: 0.99
GFR calc Af Amer: >60
Glucose, Bld: 102 — ABNORMAL HIGH

## 2011-01-26 LAB — CBC
MCHC: 34.3
MCV: 91.1
RBC: 4.53
RDW: 14.2 — ABNORMAL HIGH

## 2011-01-26 LAB — URINALYSIS, ROUTINE W REFLEX MICROSCOPIC
Bilirubin Urine: NEGATIVE
Glucose, UA: NEGATIVE
Hgb urine dipstick: NEGATIVE
Ketones, ur: NEGATIVE
Protein, ur: NEGATIVE
Urobilinogen, UA: 0.2

## 2011-01-26 LAB — DIFFERENTIAL
Basophils Absolute: 0
Basophils Relative: 0
Eosinophils Absolute: 0.2
Monocytes Absolute: 0.8 — ABNORMAL HIGH
Monocytes Relative: 10
Neutrophils Relative %: 60

## 2011-02-05 DIAGNOSIS — M25519 Pain in unspecified shoulder: Secondary | ICD-10-CM

## 2011-02-05 HISTORY — DX: Pain in unspecified shoulder: M25.519

## 2011-12-05 DIAGNOSIS — R634 Abnormal weight loss: Secondary | ICD-10-CM

## 2011-12-05 HISTORY — DX: Abnormal weight loss: R63.4

## 2012-09-19 ENCOUNTER — Encounter: Payer: Self-pay | Admitting: *Deleted

## 2012-09-23 ENCOUNTER — Non-Acute Institutional Stay (SKILLED_NURSING_FACILITY): Payer: Medicare Other | Admitting: Geriatric Medicine

## 2012-09-23 ENCOUNTER — Encounter: Payer: Self-pay | Admitting: Geriatric Medicine

## 2012-09-23 DIAGNOSIS — F329 Major depressive disorder, single episode, unspecified: Secondary | ICD-10-CM

## 2012-09-23 DIAGNOSIS — R609 Edema, unspecified: Secondary | ICD-10-CM

## 2012-09-23 DIAGNOSIS — H9190 Unspecified hearing loss, unspecified ear: Secondary | ICD-10-CM

## 2012-09-23 DIAGNOSIS — Z66 Do not resuscitate: Secondary | ICD-10-CM

## 2012-09-23 DIAGNOSIS — R634 Abnormal weight loss: Secondary | ICD-10-CM | POA: Insufficient documentation

## 2012-09-23 DIAGNOSIS — R413 Other amnesia: Secondary | ICD-10-CM

## 2012-09-23 DIAGNOSIS — R4701 Aphasia: Secondary | ICD-10-CM

## 2012-09-23 NOTE — Assessment & Plan Note (Signed)
Weight stable 154-158 pounds last 3 months. Still below baseline weight 163 pounds April 2013. Nurse reports patient eats frequently during the day as well as at meals.

## 2012-09-23 NOTE — Assessment & Plan Note (Signed)
Unchanged, minimal communication skills

## 2012-09-23 NOTE — Progress Notes (Signed)
Patient ID: [name], male   DOB: [birth year], 79 y.o.   MRN: [id number] [id number] [phone])  Chief Complaint  Patient presents with  . Medical Managment of Chronic Issues    Memory Loss, Aphasia    HPI: This is a 79 y.o. male resident of [facility],  Memory Care section evaluated today for management of ongoing medical issues.  Review of record shows no significant acute issues.   Nurse reports patient is getting along fairly well, ambulates frequently, is eating very well, communication remains the biggest obstacle. Family remains involved.  Bathing: Dependent Bladder Management: Occasional incontinence, Bowel Management: Occasional incontinence Feeding: Supervision Hygiene and Grooming: Minimal Assist, Toileting / Clothing: Minimal Assist Sit to Stand: Supervision,  Walk: Independent  Allergies  Allergies  Allergen Reactions  . Aricept (Donepezil Hcl)   . Galantamine Hydrobromide Nausea Only  . Namenda (Memantine Hcl) Nausea And Vomiting  . Penicillins Nausea Only   Medications Reviewed  Data Reviewed    Radiology:   ION:[id number], external 07/19/2011 Lipid: cholesterol 121, triglyceride 72, HDL 41, LDL 66  08/28/11 Vit D 55 12/05/11 WBC 7.2, Hgb 12.9, Hct 37, Plt 259 Glu 89, BUN 12, Cr. .95, Na 140, K+ 4.2. Protein 5.9, alb 3.9. GFR 72 TSH 4.7     Review of Systems  DATA OBTAINED: from nurse, medical record GENERAL: Feels well   No fevers, fatigue, change in appetite or weight SKIN: No itch, rash or open wounds EYES: No eye pain, dryness or itching   EARS: No earache, change in hearing DEAF NOSE: No congestion, drainage or bleeding MOUTH/THROAT: No mouth or tooth pain  No sore throat No difficulty chewing or swallowing RESPIRATORY: No cough, wheezing, SOB CARDIAC: No chest pain,   No edema. GI: No abdominal pain  No N/V/D or constipation   GU: No dysuria, frequency or urgency  No change in urine volume or character   MUSCULOSKELETAL: No joint pain, swelling or stiffness  No back pain  No muscle ache, pain, weakness  Gait is steady  No recent falls.  NEUROLOGIC: No dizziness, fainting, headache,  No change in mental status.  PSYCHIATRIC: No feelings of anxiety, depression Sleeps well.  No behavior issue.   Physical Exam Filed Vitals:   09/23/12 1452  Pulse: 59  Temp: 97.3 F (36.3 C)  Resp: 20  Weight: 154 lb (69.854 kg)   GENERAL APPEARANCE: No acute distress, appropriately groomed, normal body habitus. Alert, pleasant, conversation is impossible due to hearing loss and dementia. Limited exam today HEAD: Normocephalic, atraumatic EYES: Conjunctiva/lids clear. Marland Kitchen  EARS: DEAF NOSE: No deformity or discharge. MOUTH/THROAT: Lips w/o lesions. Oral mucosa, tongue moist, w/o lesion.   NECK: Supple, full ROM. No thyroid tenderness, enlargement or nodule LYMPHATICS: No head, neck or supraclavicular adenopathy RESPIRATORY: Breathing is even, unlabored. Lung sounds are clear and full.  CARDIOVASCULAR: Heart RRR. No murmur or extra heart sounds  EDEMA: Trace peripheral  edema.   MUSCULOSKELETAL: Moves all extremities with full ROM, strength and tone. Back is kyphotic, no scoliosis or spinal process tenderness. Gait is steady NEUROLOGIC: Unable to assess orientation, speech clear, language is impaired,  no tremor.   PSYCHIATRIC: Initially friendly and does not like exam.  ASSESSMENT/PLAN  Unspecified hearing loss Unchanged. Combination of profound hearing loss and memory and aphasia impedes effective communication  Aphasia Unchanged, minimal communication skills  Depressive disorder, not elsewhere classified No report of problems with mood or behavior. MDS reviewed from April 2014 PHQ-9-OV unchanged at  3/30. Continue  Celexa and mirtazapine, will attempt dose reduction of Celexa.  Edema Patient is not wearing compression hose, lower extremity edema is improved  Loss of weight Weight stable 154-158  pounds last 3 months. Still below baseline weight 163 pounds April 2013. Nurse reports patient eats frequently during the day as well as at meals.  Memory loss MDS 07/2012:  BIMS not performed, staff assessment reveals severe cognitive impairment; language/general communication severely impaired.PHQ-9OV 3/30. Functional status is unchanged. Continues to be an elopement risk.  Patient is assisted off unit for walks, this deters his desire to leave the unaccompanied.   Follow up: Routine or as needed  Deunta Beneke T.Gracey Tolle, NP-C 09/23/2012

## 2012-09-23 NOTE — Assessment & Plan Note (Signed)
Unchanged. Combination of profound hearing loss and memory and aphasia impedes effective communication

## 2012-09-23 NOTE — Assessment & Plan Note (Addendum)
No report of problems with mood or behavior. MDS reviewed from April 2014 PHQ-9-OV unchanged at 3/30. Continue  Celexa and mirtazapine, will attempt dose reduction of Celexa.

## 2012-09-23 NOTE — Assessment & Plan Note (Signed)
Patient is not wearing compression hose, lower extremity edema is improved

## 2012-09-23 NOTE — Assessment & Plan Note (Addendum)
MDS 07/2012:  BIMS not performed, staff assessment reveals severe cognitive impairment; language/general communication severely impaired.PHQ-9OV 3/30. Functional status is unchanged. Continues to be an elopement risk.  Patient is assisted off unit for walks, this deters his desire to leave the unaccompanied.

## 2012-10-02 MED ORDER — CITALOPRAM HYDROBROMIDE 20 MG PO TABS: 10.0000 mg | ORAL_TABLET | Freq: Every day | ORAL | Status: DC

## 2012-12-30 ENCOUNTER — Emergency Department (HOSPITAL_COMMUNITY): Payer: Medicare Other

## 2012-12-30 ENCOUNTER — Observation Stay (HOSPITAL_COMMUNITY)
Admission: EM | Admit: 2012-12-30 | Discharge: 2012-12-31 | Disposition: A | Discharge status: Skilled Nursing Facility | Payer: Medicare Other | Attending: Neurology | Admitting: Neurology

## 2012-12-30 ENCOUNTER — Encounter (HOSPITAL_COMMUNITY): Payer: Self-pay

## 2012-12-30 DIAGNOSIS — S0101XA Laceration without foreign body of scalp, initial encounter: Secondary | ICD-10-CM

## 2012-12-30 DIAGNOSIS — Y9210 Unspecified residential institution as the place of occurrence of the external cause: Secondary | ICD-10-CM | POA: Insufficient documentation

## 2012-12-30 DIAGNOSIS — F0391 Unspecified dementia with behavioral disturbance: Secondary | ICD-10-CM | POA: Insufficient documentation

## 2012-12-30 DIAGNOSIS — S065X0A Traumatic subdural hemorrhage without loss of consciousness, initial encounter: Secondary | ICD-10-CM

## 2012-12-30 DIAGNOSIS — W1809XA Striking against other object with subsequent fall, initial encounter: Secondary | ICD-10-CM | POA: Insufficient documentation

## 2012-12-30 DIAGNOSIS — I629 Nontraumatic intracranial hemorrhage, unspecified: Secondary | ICD-10-CM

## 2012-12-30 DIAGNOSIS — I10 Essential (primary) hypertension: Secondary | ICD-10-CM | POA: Insufficient documentation

## 2012-12-30 DIAGNOSIS — R413 Other amnesia: Secondary | ICD-10-CM

## 2012-12-30 DIAGNOSIS — W19XXXA Unspecified fall, initial encounter: Secondary | ICD-10-CM

## 2012-12-30 DIAGNOSIS — Z79899 Other long term (current) drug therapy: Secondary | ICD-10-CM | POA: Insufficient documentation

## 2012-12-30 DIAGNOSIS — F03918 Unspecified dementia, unspecified severity, with other behavioral disturbance: Secondary | ICD-10-CM | POA: Insufficient documentation

## 2012-12-30 DIAGNOSIS — S066X0A Traumatic subarachnoid hemorrhage without loss of consciousness, initial encounter: Principal | ICD-10-CM | POA: Insufficient documentation

## 2012-12-30 DIAGNOSIS — S0100XA Unspecified open wound of scalp, initial encounter: Secondary | ICD-10-CM | POA: Insufficient documentation

## 2012-12-30 LAB — CBC
HCT: 38.9 % — ABNORMAL LOW (ref 39.0–52.0)
MCH: 31.8 pg (ref 26.0–34.0)
MCHC: 33.7 g/dL (ref 30.0–36.0)
MCV: 94.4 fL (ref 78.0–100.0)
RDW: 13.8 % (ref 11.5–15.5)

## 2012-12-30 LAB — BASIC METABOLIC PANEL
BUN: 16 mg/dL (ref 6–23)
Calcium: 9.7 mg/dL (ref 8.4–10.5)
Creatinine, Ser: 0.79 mg/dL (ref 0.50–1.35)
GFR calc Af Amer: >90 mL/min (ref >90)
GFR calc non Af Amer: 78 mL/min — ABNORMAL LOW (ref >90)

## 2012-12-30 MED ORDER — ZIPRASIDONE MESYLATE 20 MG IM SOLR
10.0000 mg | Freq: Once | INTRAMUSCULAR | Status: AC
  Administered 2012-12-30: 10 mg via INTRAMUSCULAR
  Filled 2012-12-30: qty 20

## 2012-12-30 MED ORDER — TETANUS-DIPHTH-ACELL PERTUSSIS 5-2.5-18.5 LF-MCG/0.5 IM SUSP
0.5000 mL | Freq: Once | INTRAMUSCULAR | Status: AC
  Administered 2012-12-30: 0.5 mL via INTRAMUSCULAR
  Filled 2012-12-30: qty 0.5

## 2012-12-30 MED ORDER — STERILE WATER FOR INJECTION IJ SOLN
INTRAMUSCULAR | Status: AC
  Administered 2012-12-30: 10 mL
  Filled 2012-12-30: qty 10

## 2012-12-30 NOTE — ED Notes (Signed)
DNR YELLOW COPY PRESENT- PT RESIDES AT [facility].  [phone]. Per staff [facility] and Lewie Chamber present with this pt had un witnessed fall causing laceration to back of head. Fall occurred at 1400.  Facility  States called EMS and was told it would be 30  Minutes for transport and was decided to transport by car. Pt came by car with two staff members.  Pt placed on room 4 .  Upon arrival c collar placed. EDP Linker in to evaluate pt. Orders given

## 2012-12-30 NOTE — ED Notes (Signed)
Patient transported to CT 

## 2012-12-30 NOTE — ED Provider Notes (Signed)
LACERATION REPAIR Performed by: Jimmye Norman Authorized by: Jimmye Norman Consent: Verbal consent obtained. Risks and benefits: risks, benefits and alternatives were discussed Consent given by: family Patient identity confirmed: provided demographic data Prepped and Draped in normal sterile fashion Wound explored  Laceration Location: occiput of head  Laceration Length: 7 cm  No Foreign Bodies seen or palpated   Irrigation method: syringe  Amount of cleaning: standard  Skin closure: staples  Number of staples: 14  Technique: simple  Patient tolerance: Patient tolerated the procedure well with no immediate complications.  Jimmye Norman, NP 12/30/12 [phone]

## 2012-12-30 NOTE — ED Provider Notes (Signed)
CSN: [id number]     Arrival date & time 12/30/12  1638 History   First MD Initiated Contact with Patient 12/30/12 1652     Chief Complaint  Patient presents with  . Fall  . Laceration  . Aggressive Behavior   (Consider location/radiation/quality/duration/timing/severity/associated sxs/prior Treatment) HPI A LEVEL 5 CAVEAT PERTAINS DUE TO ALTERED MENTAL STATUS/DEMENTIA.  Per nursing home staff patient fell and hit the back of his head against a med cart.  He was brought in by private vehicle for evaluation of laceration to the back of his head.  Per staff accompanying him he is unchanged in terms of his baseline mental status.  No report of LOC, no seizure activity.   Past Medical History  Diagnosis Date  . Loss of weight 12/05/2011  . Pain in joint, shoulder region 02/05/2011  . Neuralgia, neuritis, and radiculitis, unspecified 12/11/2010  . Unspecified venous (peripheral) insufficiency 09/01/2008  . Unspecified hypothyroidism 04/05/2008  . Rickets, active 04/05/2008  . Nocturia 11/25/2006  . Aphasia 05/27/2006  . Unspecified persistent mental disorders due to conditions classified elsewhere 12/10/2005  . 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 Unspecified constipation 12/10/2005  . Other and unspecified hyperlipidemia 07/25/2005  . Tension headache 07/25/2005  . Occlusion and stenosis of carotid artery without mention of cerebral infarction 07/16/2005  . Memory loss 06/25/2005  . Unspecified hearing loss 08/16/2004  . Insomnia, unspecified 07/05/2004  . Allergic rhinitis due to pollen 01/05/2004  . Depressive disorder, not elsewhere classified 12/22/2003  . Unspecified essential hypertension 12/22/2003  . Inguinal hernia without mention of obstruction or gangrene, unilateral or unspecified, (not specified as recurrent) 12/22/2003  . Diaphragmatic hernia without mention of obstruction or gangrene 12/22/2003  . Diverticulosis of colon (without mention of hemorrhage) 12/22/2003`   . Osteoarthrosis, unspecified whether generalized or localized, unspecified site 12/22/2003  . Scoliosis (and kyphoscoliosis), idiopathic 12/22/2003  . Edema 12/22/2003  . Hypertrophy of prostate without urinary obstruction and other lower urinary tract symptoms (LUTS) 12/30/2002  . Osteoporosis, unspecified 08/21/2002  . Loss of weight    Past Surgical History  Procedure Laterality Date  . Tonsillectomy  1930  . Carotid endarterectomy Left 07/2005    with Dacron patch angioplasty  . Colonoscopy  2006    no polyps   Family History  Problem Relation Age of Onset  . Cancer Mother   . Cancer Father     emphysema   History  Substance Use Topics  . Smoking status: Never Smoker   . Smokeless tobacco: Never Used  . Alcohol Use: No    Review of Systems UNABLE TO OBTAIN ROS DUE TO LEVEL 5 CAVEAT  Allergies  Aricept; Galantamine hydrobromide; Namenda; and Penicillins  Home Medications   Current Outpatient Rx  Name  Route  Sig  Dispense  Refill  . aspirin 81 MG tablet   Oral   Take 81 mg by mouth daily. Take one daily to help  prevent stroke and heart attack, and blood clot.         . cholecalciferol (VITAMIN D) 1000 UNITS tablet   Oral   Take 2,000 Units by mouth daily. Take one tablet daily for vitamin d supplement.         . citalopram (CELEXA) 20 MG tablet   Oral   Take 10 mg by mouth every other day. Take one tablet  daily to help anxiety and depression.         . enalapril (VASOTEC) 10 MG tablet   Oral  Take 10 mg by mouth daily. Take one tablet daily to treat HTN.         . finasteride (PROSCAR) 5 MG tablet   Oral   Take 5 mg by mouth daily. Take 1 tablet once daily to help prostate.         Marland Kitchen ipratropium-albuterol (DUONEB) 0.5-2.5 (3) MG/3ML SOLN   Nebulization   Take 3 mLs by nebulization every 8 (eight) weeks. Use in nebulizer every 8 hours as needed to help breathing.         . mirtazapine (REMERON) 30 MG tablet   Oral   Take 30 mg by  mouth at bedtime. Take 1 tablet  at bedtime to treat depression,anxiety, increase appetite.         . polyethylene glycol (MIRALAX / GLYCOLAX) packet   Oral   Take 17 g by mouth daily. Give 17 gram daily in 8 oz water or juice to prevent constipation.          BP 168/87  Pulse 72  Temp(Src) 97.5 F (36.4 C) (Oral)  Resp 22  SpO2 97% Physical Exam Physical Examination: General appearance - alert, chronically ill appearing, and in no distress Mental status - awake and alert, no oriented to person place or time Head- laceration over posterior scalp- 6-7cm laceration over posterior scalp- no arterial bleed Eyes - pupils equal and reactive, extraocular eye movements intact Ears - bilateral TM's and external ear canals normal Mouth - mucous membranes moist, pharynx normal without lesions Neck - no ttp over cervical spine, c-collar applied on arrival Chest - clear to auscultation, no wheezes, rales or rhonchi, symmetric air entry Heart - normal rate, regular rhythm, normal S1, S2, no murmurs, rubs, clicks or gallops Abdomen - soft, nontender, nondistended, no masses or organomegaly Neurological - alert, not oriented- this is his baseline- strength intact in extremities x 4 Musculoskeletal - no joint tenderness, deformity or swelling Extremities - peripheral pulses normal, no pedal edema, no clubbing or cyanosis Skin - normal coloration and turgor, no rashes Psych- not oriented, combative with staff  ED Course  Procedures (including critical care time)  7:40 PM CT scan reviewed with radiologist.  Also discussed results with patient's son- Cornel Werber cell # [phone]- paging neurosurgery now.  Pt has acute bleed in brain- some subdural, some may be subarachnoid and some intraventricular blood.  To get better picture of where blood is (there was motion artifact) radiologist recommends repeat head CT.  After d/w son- pt is DNR, he has dementia- son would prefer as little intervention as  possible with the thoughts of keeping patient as comfortable with as little agitation as possible.  We are giving geodon to help sedate him for stapling of head laceration- there does not appear to be skull fracture on CT scan.  I will d/w neurosurgery but due to patients status do not feel there will be much helpful intervention surgically at this time.  Son is in agreement with admission overnight and repeat CT scan in AM to assess whether bleeding has increased. He re-ieterated that he does not want heroic measures- and DNR paperwork was brought with him from [place].    8:53 PM d/w Dr. Dutch Quint, neurosurgery, he does not recommend any acute intervention- he has reviewed the scans.  Will admit overnight to hospitalist service- only will need repeat scan in AM if he has signs of detioration.   8:55 PM laceration repaired by midlevel Felicie Morn  9:22 PM d/w Dr. Conley Rolls, [facility]-  he states that neurology would need to admit this patient,  Paging neurology now.   9:39 PM I have d/w Dr. Thad Ranger, neurology, she agrees to admit patient to stepdown bed- he will need to be observed at [facility].   10:16 PM I have talked again with Dr. Conley Rolls, he re-iterates that this is a policy that patient with intracranial bleed must be admitted at [facility].  Labs Review Labs Reviewed  CBC - Abnormal; Notable for the following:    WBC 13.0 (*)    RBC 4.12 (*)    HCT 38.9 (*)    All other components within normal limits  BASIC METABOLIC PANEL - Abnormal; Notable for the following:    Glucose, Bld 142 (*)    GFR calc non Af Amer 78 (*)    All other components within normal limits   Imaging Review Ct Head Wo Contrast  12/30/2012   *RADIOLOGY REPORT*  Clinical Data:  Fall, laceration and aggressive behavior.  Repeat scans following administration of sedative.  CT HEAD WITHOUT CONTRAST CT CERVICAL SPINE WITHOUT CONTRAST  Technique:  Multidetector CT imaging of the head and cervical spine was performed following the standard protocol  without intravenous contrast.  Multiplanar CT image reconstructions of the cervical spine were also generated.  Comparison:  CT head and cervical spine obtained earlier today at 18 08:00 p.m.; prior MRI of the brain 06/03/2006  CT HEAD  Findings: Again, significantly limited examination due to extreme patient motion.  Images are not appreciably better than the first scan attempts.  Again, there is subarachnoid hemorrhage and / or hemorrhagic contusion involving the posterior left frontal convexity at the vertex, as well as overlying the left occipital lobe.  Focal rounded high attenuation within the left occipital horn may represent a small amount of intraventricular hemorrhage. Background of atrophy and chronic microvascular ischemic white matter changes is similar compared to prior.  No new hydrocephalus, or midline shift.  Soft tissue contusion overlying the occipital scalp.  No definite calvarial fracture identified within the limitations of this motion degraded exam.  Chronic opacification of the left greater than right mastoid air cells.  IMPRESSION:  1.  Significantly limited exam due to extreme patient motion. Overall, images are similar compared to earlier this evening. 2.  Subarachnoid hemorrhage and / or hemorrhagic contusion overlying the posterior left frontal lobe at the vertex, and overlying the left occipital lobe. 3.  Rounded high attenuation within the occipital horn of the left lateral ventricle favored to reflect a small amount of hemorrhage. 4.  Soft tissue laceration overlying the occiput without convincing evidence of associated fracture within the limitations of this study. 5.  Background atrophy and microvascular ischemic white matter disease.  CT CERVICAL SPINE  Findings: Exam detail significantly limited by patient motion. Images are slightly improved compared to those obtained earlier today.  The dens appears grossly intact.  There is extensive multilevel cervical spondylosis and  degenerative disc disease.  No definite displaced fracture identified.  Stable trace likely degenerative anterolisthesis of C4-C5.  No prevertebral soft tissue swelling.  IMPRESSION:  1. Slightly improved exam detail with some persistent diminution related to patient motion. 2.  No acute fracture or malalignment identified. 3.  Advanced multilevel cervical spondylosis.   Original Report Authenticated By: Malachy Moan, M.D.   Ct Head Wo Contrast  12/30/2012   *RADIOLOGY REPORT*  Clinical Data:  Status post fall.  CT HEAD WITHOUT CONTRAST CT CERVICAL SPINE WITHOUT CONTRAST  Technique:  Multidetector CT imaging of  the head and cervical spine was performed following the standard protocol without intravenous contrast.  Multiplanar CT image reconstructions of the cervical spine were also generated.  Comparison:  Brain MRI from 06/03/2006  CT HEAD  Findings: Exam detail is diminished due to motion artifact and suboptimal patient positioning.  There is a hyperattenuating mass overlying the left frontal cerebral convexity compatible with acute hematoma.  This measures approximately 2.7 cm, image 14/series 5. There is likely both cerebral dural and subarachnoid component to this hematoma.  A second area of hemorrhage is involving the left occipital lobe with both small subdural and subarachnoid components.  Hyper attenuating focus within the occipital horn of the left lateral ventricle is noted measuring 9 mm, image 6/series 5.  This is new from the previous MRI from 06/03/2006.  There is diffuse patchy low density throughout the subcortical and periventricular white matter consistent with chronic small vessel ischemic change.  There is prominence of the sulci and ventricles consistent with brain atrophy.  There is no evidence for acute brain infarct, hemorrhage or mass.  The paranasal sinuses appear clear.  There is opacification of both mastoid air cells.  The skull appears intact.  Within the occipital horn   IMPRESSION:  1.  Exam detail is significantly diminished due to motion artifact and patient positioning. 2.  There are least two areas of subarachnoid and subdural hematoma involving the left frontal lobe and left occipital lobe.  Improved definition may be obtained with repeat imaging without motion artifact. 3.  Suspect intraventricular hemorrhage involving the occipital horn of the left lateral ventricle. 4.  Small vessel ischemic change and brain atrophy.  CT CERVICAL SPINE  Findings: Image detail is diminished due to motion artifact.  There is an anterolisthesis of C4 on C5.  There is multilevel disc space narrowing and ventral endplate spurring identified throughout the cervical spine.  No fractures are identified.  IMPRESSION:  1.  Exam detail is diminished secondary to motion artifact. 2.  No fracture identified. 3.  Advanced cervical spondylosis noted.  Critical Value/emergent results were called by telephone at the time of interpretation on 12/30/2012. at 6:30 p.m. to Dr. Karma Ganja, who verbally acknowledged these results.   Original Report Authenticated By: Signa Kell, M.D.   Ct Cervical Spine Wo Contrast  12/30/2012   *RADIOLOGY REPORT*  Clinical Data:  Fall, laceration and aggressive behavior.  Repeat scans following administration of sedative.  CT HEAD WITHOUT CONTRAST CT CERVICAL SPINE WITHOUT CONTRAST  Technique:  Multidetector CT imaging of the head and cervical spine was performed following the standard protocol without intravenous contrast.  Multiplanar CT image reconstructions of the cervical spine were also generated.  Comparison:  CT head and cervical spine obtained earlier today at 18 08:00 p.m.; prior MRI of the brain 06/03/2006  CT HEAD  Findings: Again, significantly limited examination due to extreme patient motion.  Images are not appreciably better than the first scan attempts.  Again, there is subarachnoid hemorrhage and / or hemorrhagic contusion involving the posterior left frontal  convexity at the vertex, as well as overlying the left occipital lobe.  Focal rounded high attenuation within the left occipital horn may represent a small amount of intraventricular hemorrhage. Background of atrophy and chronic microvascular ischemic white matter changes is similar compared to prior.  No new hydrocephalus, or midline shift.  Soft tissue contusion overlying the occipital scalp.  No definite calvarial fracture identified within the limitations of this motion degraded exam.  Chronic opacification of the left  greater than right mastoid air cells.  IMPRESSION:  1.  Significantly limited exam due to extreme patient motion. Overall, images are similar compared to earlier this evening. 2.  Subarachnoid hemorrhage and / or hemorrhagic contusion overlying the posterior left frontal lobe at the vertex, and overlying the left occipital lobe. 3.  Rounded high attenuation within the occipital horn of the left lateral ventricle favored to reflect a small amount of hemorrhage. 4.  Soft tissue laceration overlying the occiput without convincing evidence of associated fracture within the limitations of this study. 5.  Background atrophy and microvascular ischemic white matter disease.  CT CERVICAL SPINE  Findings: Exam detail significantly limited by patient motion. Images are slightly improved compared to those obtained earlier today.  The dens appears grossly intact.  There is extensive multilevel cervical spondylosis and degenerative disc disease.  No definite displaced fracture identified.  Stable trace likely degenerative anterolisthesis of C4-C5.  No prevertebral soft tissue swelling.  IMPRESSION:  1. Slightly improved exam detail with some persistent diminution related to patient motion. 2.  No acute fracture or malalignment identified. 3.  Advanced multilevel cervical spondylosis.   Original Report Authenticated By: Malachy Moan, M.D.   Ct Cervical Spine Wo Contrast  12/30/2012   *RADIOLOGY REPORT*   Clinical Data:  Status post fall.  CT HEAD WITHOUT CONTRAST CT CERVICAL SPINE WITHOUT CONTRAST  Technique:  Multidetector CT imaging of the head and cervical spine was performed following the standard protocol without intravenous contrast.  Multiplanar CT image reconstructions of the cervical spine were also generated.  Comparison:  Brain MRI from 06/03/2006  CT HEAD  Findings: Exam detail is diminished due to motion artifact and suboptimal patient positioning.  There is a hyperattenuating mass overlying the left frontal cerebral convexity compatible with acute hematoma.  This measures approximately 2.7 cm, image 14/series 5. There is likely both cerebral dural and subarachnoid component to this hematoma.  A second area of hemorrhage is involving the left occipital lobe with both small subdural and subarachnoid components.  Hyper attenuating focus within the occipital horn of the left lateral ventricle is noted measuring 9 mm, image 6/series 5.  This is new from the previous MRI from 06/03/2006.  There is diffuse patchy low density throughout the subcortical and periventricular white matter consistent with chronic small vessel ischemic change.  There is prominence of the sulci and ventricles consistent with brain atrophy.  There is no evidence for acute brain infarct, hemorrhage or mass.  The paranasal sinuses appear clear.  There is opacification of both mastoid air cells.  The skull appears intact.  Within the occipital horn  IMPRESSION:  1.  Exam detail is significantly diminished due to motion artifact and patient positioning. 2.  There are least two areas of subarachnoid and subdural hematoma involving the left frontal lobe and left occipital lobe.  Improved definition may be obtained with repeat imaging without motion artifact. 3.  Suspect intraventricular hemorrhage involving the occipital horn of the left lateral ventricle. 4.  Small vessel ischemic change and brain atrophy.  CT CERVICAL SPINE  Findings:  Image detail is diminished due to motion artifact.  There is an anterolisthesis of C4 on C5.  There is multilevel disc space narrowing and ventral endplate spurring identified throughout the cervical spine.  No fractures are identified.  IMPRESSION:  1.  Exam detail is diminished secondary to motion artifact. 2.  No fracture identified. 3.  Advanced cervical spondylosis noted.  Critical Value/emergent results were called by telephone at  the time of interpretation on 12/30/2012. at 6:30 p.m. to Dr. Karma Ganja, who verbally acknowledged these results.   Original Report Authenticated By: Signa Kell, M.D.    MDM   1. Intracranial bleed   2. Fall, initial encounter   3. Scalp laceration, initial encounter    Pt presenting with c/o fall with scalp laceration.  Head CT shows intracranial bleed- both subdural and likely subarachnoid.  As documented above- I have d/w patients son, neurosurgery, [facility] and neurology.  Pt is to be [facility] at [facility].  He cannot be admitted at [facility] as documented above.  Pt is DNR.  Neurosurgery recommends no acute intervention.  Son is agreeable with plan for admission and keeping patient comfortable as possible. Son is looking forward to patient being discharged back to [facility] where he will be in more familiar surroundings.    Note- will need staples out in 3-5 days    Ethelda Chick, MD 12/30/12 2332

## 2012-12-30 NOTE — ED Provider Notes (Signed)
Medical screening examination/treatment/procedure(s) were conducted as a shared visit with non-physician practitioner(s) and myself.  I personally evaluated the patient during the encounter  Ethelda Chick, MD 12/30/12 2123

## 2012-12-30 NOTE — Progress Notes (Signed)
CSW confirmed per pts chart that he is a current resident of [facility].  No family was at pts bedside to consult.  Marva Panda, Theresia Majors  [phone] .12/30/2012 8:45 pm

## 2012-12-31 ENCOUNTER — Observation Stay (HOSPITAL_COMMUNITY): Payer: Medicare Other

## 2012-12-31 DIAGNOSIS — I629 Nontraumatic intracranial hemorrhage, unspecified: Secondary | ICD-10-CM

## 2012-12-31 DIAGNOSIS — S065X0A Traumatic subdural hemorrhage without loss of consciousness, initial encounter: Secondary | ICD-10-CM

## 2012-12-31 DIAGNOSIS — R413 Other amnesia: Secondary | ICD-10-CM

## 2012-12-31 DIAGNOSIS — S066X0A Traumatic subarachnoid hemorrhage without loss of consciousness, initial encounter: Secondary | ICD-10-CM

## 2012-12-31 LAB — URINE MICROSCOPIC-ADD ON

## 2012-12-31 LAB — URINALYSIS, ROUTINE W REFLEX MICROSCOPIC
Bilirubin Urine: NEGATIVE
Glucose, UA: NEGATIVE mg/dL
Ketones, ur: NEGATIVE mg/dL
Nitrite: NEGATIVE
pH: 5.5 (ref 5.0–8.0)

## 2012-12-31 LAB — CBC
Platelets: 196 10*3/uL (ref 150–400)
RBC: 3.87 MIL/uL — ABNORMAL LOW (ref 4.22–5.81)
RDW: 14 % (ref 11.5–15.5)
WBC: 9.9 10*3/uL (ref 4.0–10.5)

## 2012-12-31 MED ORDER — SODIUM CHLORIDE 0.9 % IJ SOLN
3.0000 mL | Freq: Two times a day (BID) | INTRAMUSCULAR | Status: DC
  Administered 2012-12-31 (×2): 3 mL via INTRAVENOUS

## 2012-12-31 MED ORDER — VITAMIN D3 25 MCG (1000 UNIT) PO TABS
2000.0000 [IU] | ORAL_TABLET | Freq: Every day | ORAL | Status: DC
  Administered 2012-12-31: 2000 [IU] via ORAL
  Filled 2012-12-31: qty 2

## 2012-12-31 MED ORDER — CITALOPRAM HYDROBROMIDE 10 MG PO TABS
10.0000 mg | ORAL_TABLET | ORAL | Status: DC
  Administered 2012-12-31: 10 mg via ORAL
  Filled 2012-12-31: qty 1

## 2012-12-31 MED ORDER — SODIUM CHLORIDE 0.9 % IV SOLN
INTRAVENOUS | Status: DC
  Administered 2012-12-31: 02:00:00 via INTRAVENOUS

## 2012-12-31 MED ORDER — ACETAMINOPHEN 650 MG RE SUPP: 650.0000 mg | Freq: Four times a day (QID) | RECTAL | Status: DC | PRN

## 2012-12-31 MED ORDER — CITALOPRAM HYDROBROMIDE 10 MG PO TABS
10.0000 mg | ORAL_TABLET | Freq: Every day | ORAL | Status: DC
  Administered 2012-12-31: 10 mg via ORAL
  Filled 2012-12-31: qty 1

## 2012-12-31 MED ORDER — ENALAPRIL MALEATE 10 MG PO TABS
10.0000 mg | ORAL_TABLET | Freq: Every day | ORAL | Status: DC
  Administered 2012-12-31: 10 mg via ORAL
  Filled 2012-12-31: qty 1

## 2012-12-31 MED ORDER — FINASTERIDE 5 MG PO TABS
5.0000 mg | ORAL_TABLET | Freq: Every day | ORAL | Status: DC
  Administered 2012-12-31: 5 mg via ORAL
  Filled 2012-12-31: qty 1

## 2012-12-31 MED ORDER — SODIUM CHLORIDE 0.9 % IV SOLN: 250.0000 mL | INTRAVENOUS | Status: DC | PRN

## 2012-12-31 MED ORDER — SODIUM CHLORIDE 0.9 % IJ SOLN: 3.0000 mL | INTRAMUSCULAR | Status: DC | PRN

## 2012-12-31 MED ORDER — SODIUM CHLORIDE 0.9 % IJ SOLN: 3.0000 mL | Freq: Two times a day (BID) | INTRAMUSCULAR | Status: DC

## 2012-12-31 MED ORDER — POLYETHYLENE GLYCOL 3350 17 G PO PACK
17.0000 g | PACK | Freq: Every day | ORAL | Status: DC
  Filled 2012-12-31: qty 1

## 2012-12-31 MED ORDER — ACETAMINOPHEN 325 MG PO TABS: 650.0000 mg | ORAL_TABLET | Freq: Four times a day (QID) | ORAL | Status: DC | PRN

## 2012-12-31 MED ORDER — MIRTAZAPINE 30 MG PO TABS
30.0000 mg | ORAL_TABLET | Freq: Every day | ORAL | Status: DC
  Filled 2012-12-31: qty 1

## 2012-12-31 MED ORDER — ONDANSETRON HCL 4 MG/2ML IJ SOLN: 4.0000 mg | Freq: Four times a day (QID) | INTRAMUSCULAR | Status: DC | PRN

## 2012-12-31 MED ORDER — ONDANSETRON HCL 4 MG PO TABS: 4.0000 mg | ORAL_TABLET | Freq: Four times a day (QID) | ORAL | Status: DC | PRN

## 2012-12-31 MED ORDER — BIOTENE DRY MOUTH MT LIQD
15.0000 mL | Freq: Two times a day (BID) | OROMUCOSAL | Status: DC
  Administered 2012-12-31: 15 mL via OROMUCOSAL

## 2012-12-31 NOTE — H&P (Signed)
Admission H&P    Chief Complaint: Fall HPI: [name] is an 79 y.o. male who is a nursing home resident.  Patient unable to provide history and history must be obtained from the chart.  It seems that the patient fell today at the facility and sustained a laceration to the back of the head.  There was no noted seizure activity or loss of consciousness.  Patient was brought to the ED for evaluation.  Patient has a history of dementia and is at baseline mental status.    Past Medical History  Diagnosis Date  . Loss of weight 12/05/2011  . Pain in joint, shoulder region 02/05/2011  . Neuralgia, neuritis, and radiculitis, unspecified 12/11/2010  . Unspecified venous (peripheral) insufficiency 09/01/2008  . Unspecified hypothyroidism 04/05/2008  . Rickets, active 04/05/2008  . Nocturia 11/25/2006  . Aphasia 05/27/2006  . Unspecified persistent mental disorders due to conditions classified elsewhere 12/10/2005  . 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 Unspecified constipation 12/10/2005  . Other and unspecified hyperlipidemia 07/25/2005  . Tension headache 07/25/2005  . Occlusion and stenosis of carotid artery without mention of cerebral infarction 07/16/2005  . Memory loss 06/25/2005  . Unspecified hearing loss 08/16/2004  . Insomnia, unspecified 07/05/2004  . Allergic rhinitis due to pollen 01/05/2004  . Depressive disorder, not elsewhere classified 12/22/2003  . Unspecified essential hypertension 12/22/2003  . Inguinal hernia without mention of obstruction or gangrene, unilateral or unspecified, (not specified as recurrent) 12/22/2003  . Diaphragmatic hernia without mention of obstruction or gangrene 12/22/2003  . Diverticulosis of colon (without mention of hemorrhage) 12/22/2003`  . Osteoarthrosis, unspecified whether generalized or localized, unspecified site 12/22/2003  . Scoliosis (and kyphoscoliosis), idiopathic 12/22/2003  . Edema 12/22/2003  . Hypertrophy of prostate  without urinary obstruction and other lower urinary tract symptoms (LUTS) 12/30/2002  . Osteoporosis, unspecified 08/21/2002  . Loss of weight     Past Surgical History  Procedure Laterality Date  . Tonsillectomy  1930  . Carotid endarterectomy Left 07/2005    with Dacron patch angioplasty  . Colonoscopy  2006    no polyps    Family History  Problem Relation Age of Onset  . Cancer Mother   . Cancer Father     emphysema   Social History:  reports that he has never smoked. He has never used smokeless tobacco. He reports that he does not drink alcohol or use illicit drugs.  Allergies:  Allergies  Allergen Reactions  . Aricept [Donepezil Hcl]   . Galantamine Hydrobromide Nausea Only  . Namenda [Memantine Hcl] Nausea And Vomiting  . Penicillins Nausea Only    Medications Prior to Admission  Medication Sig Dispense Refill  . cholecalciferol (VITAMIN D) 1000 UNITS tablet Take 2,000 Units by mouth daily. Take one tablet daily for vitamin d supplement.      . citalopram (CELEXA) 20 MG tablet Take 10 mg by mouth every other day. Take one tablet  daily to help anxiety and depression.      . enalapril (VASOTEC) 10 MG tablet Take 10 mg by mouth daily. Take one tablet daily to treat HTN.      . finasteride (PROSCAR) 5 MG tablet Take 5 mg by mouth daily. Take 1 tablet once daily to help prostate.      Marland Kitchen ipratropium-albuterol (DUONEB) 0.5-2.5 (3) MG/3ML SOLN Take 3 mLs by nebulization every 8 (eight) weeks. Use in nebulizer every 8 hours as needed to help breathing.      . mirtazapine (REMERON)  30 MG tablet Take 30 mg by mouth at bedtime. Take 1 tablet  at bedtime to treat depression,anxiety, increase appetite.      . polyethylene glycol (MIRALAX / GLYCOLAX) packet Take 17 g by mouth daily. Give 17 gram daily in 8 oz water or juice to prevent constipation.      . [DISCONTINUED] aspirin 81 MG tablet Take 81 mg by mouth daily. Take one daily to help  prevent stroke and heart attack, and blood  clot.        ROS: Unable to provide  Physical Examination: Blood pressure 113/85, pulse 102, temperature 97.5 F (36.4 C), temperature source Oral, resp. rate 15, SpO2 95.00%.  General Examination:    HEENT-  Normocephalic.  Stapled laceration on the back of his head.  Normal external eye and conjunctiva.  Normal TM's bilaterally.  Normal auditory canals and external ears. Normal external nose, mucus membranes and septum.  Normal pharynx. Neck supple with no masses, nodes, nodules or enlargement. Cardiovascular - S1, S2 normal Lungs - chest clear, no wheezing, rales, normal symmetric air entry Abdomen - painful to palpation Extremities - no edema  Neurologic Examination: Mental Status: Alert.  Speech unintelligible.  Does not follow commands.  Combative.   Cranial Nerves: II: Discs flat bilaterally; Blinks to bilateral confrontation.  Pupils equal, round, reactive to light and accommodation III,IV, VI: ptosis not present, extra-ocular motions intact bilaterally V,VII: grimace symmetric, facial light touch sensation normal bilaterally VIII: hearing normal bilaterally IX,X: gag reflex present XI: bilateral shoulder shrug XII: tongue extension unable to be performed Motor: Moves all extremities against gravity and able to lift off the bed.  Does not cooperate for formal testing Sensory: Responds to noxious stimuli bilaterally Deep Tendon Reflexes: 2+ in the upper extremities and absent in the lower extremities Plantars: Does not cooperate for testing Cerebellar: Unable to perform Gait: Unable to perform CV: pulses palpable throughout   Laboratory Studies: Basic Metabolic Panel:  Recent Labs Lab 12/30/12 2150  NA 142  K 3.7  CL 105  CO2 25  GLUCOSE 142*  BUN 16  CREATININE 0.79  CALCIUM 9.7    Liver Function Tests: No results found for this basename: AST, ALT, ALKPHOS, BILITOT, PROT, ALBUMIN,  in the last 168 hours No results found for this basename: LIPASE,  AMYLASE,  in the last 168 hours No results found for this basename: AMMONIA,  in the last 168 hours  CBC:  Recent Labs Lab 12/30/12 2150  WBC 13.0*  HGB 13.1  HCT 38.9*  MCV 94.4  PLT 220    Cardiac Enzymes: No results found for this basename: CKTOTAL, CKMB, CKMBINDEX, TROPONINI,  in the last 168 hours  BNP: No components found with this basename: POCBNP,   CBG: No results found for this basename: GLUCAP,  in the last 168 hours  Microbiology: Results for orders placed during the hospital encounter of 03/30/07  URINE CULTURE     Status: None   Collection Time    03/30/07 10:07 AM      Result Value Range Status   Specimen Description URINE, CLEAN CATCH   Final   Special Requests NONE   Final   Colony Count NO GROWTH   Final   Culture NO GROWTH   Final   Report Status 04/01/2007 FINAL   Final    Coagulation Studies: No results found for this basename: LABPROT, INR,  in the last 72 hours  Urinalysis: No results found for this basename: COLORURINE, APPERANCEUR, LABSPEC, PHURINE,  GLUCOSEU, HGBUR, BILIRUBINUR, KETONESUR, PROTEINUR, UROBILINOGEN, NITRITE, LEUKOCYTESUR,  in the last 168 hours  Lipid Panel: No results found for this basename: chol,  trig,  hdl,  cholhdl,  vldl,  ldlcalc    HgbA1C: No results found for this basename: HGBA1C    Urine Drug Screen:   No results found for this basename: labopia,  cocainscrnur,  labbenz,  amphetmu,  thcu,  labbarb     Alcohol Level: No results found for this basename: ETH,  in the last 168 hours   Imaging: Ct Head Wo Contrast  12/30/2012   *RADIOLOGY REPORT*  Clinical Data:  Fall, laceration and aggressive behavior.  Repeat scans following administration of sedative.  CT HEAD WITHOUT CONTRAST CT CERVICAL SPINE WITHOUT CONTRAST  Technique:  Multidetector CT imaging of the head and cervical spine was performed following the standard protocol without intravenous contrast.  Multiplanar CT image reconstructions of the cervical  spine were also generated.  Comparison:  CT head and cervical spine obtained earlier today at 18 08:00 p.m.; prior MRI of the brain 06/03/2006  CT HEAD  Findings: Again, significantly limited examination due to extreme patient motion.  Images are not appreciably better than the first scan attempts.  Again, there is subarachnoid hemorrhage and / or hemorrhagic contusion involving the posterior left frontal convexity at the vertex, as well as overlying the left occipital lobe.  Focal rounded high attenuation within the left occipital horn may represent a small amount of intraventricular hemorrhage. Background of atrophy and chronic microvascular ischemic white matter changes is similar compared to prior.  No new hydrocephalus, or midline shift.  Soft tissue contusion overlying the occipital scalp.  No definite calvarial fracture identified within the limitations of this motion degraded exam.  Chronic opacification of the left greater than right mastoid air cells.  IMPRESSION:  1.  Significantly limited exam due to extreme patient motion. Overall, images are similar compared to earlier this evening. 2.  Subarachnoid hemorrhage and / or hemorrhagic contusion overlying the posterior left frontal lobe at the vertex, and overlying the left occipital lobe. 3.  Rounded high attenuation within the occipital horn of the left lateral ventricle favored to reflect a small amount of hemorrhage. 4.  Soft tissue laceration overlying the occiput without convincing evidence of associated fracture within the limitations of this study. 5.  Background atrophy and microvascular ischemic white matter disease.  CT CERVICAL SPINE  Findings: Exam detail significantly limited by patient motion. Images are slightly improved compared to those obtained earlier today.  The dens appears grossly intact.  There is extensive multilevel cervical spondylosis and degenerative disc disease.  No definite displaced fracture identified.  Stable trace likely  degenerative anterolisthesis of C4-C5.  No prevertebral soft tissue swelling.  IMPRESSION:  1. Slightly improved exam detail with some persistent diminution related to patient motion. 2.  No acute fracture or malalignment identified. 3.  Advanced multilevel cervical spondylosis.   Original Report Authenticated By: Malachy Moan, M.D.   Ct Head Wo Contrast  12/30/2012   *RADIOLOGY REPORT*  Clinical Data:  Status post fall.  CT HEAD WITHOUT CONTRAST CT CERVICAL SPINE WITHOUT CONTRAST  Technique:  Multidetector CT imaging of the head and cervical spine was performed following the standard protocol without intravenous contrast.  Multiplanar CT image reconstructions of the cervical spine were also generated.  Comparison:  Brain MRI from 06/03/2006  CT HEAD  Findings: Exam detail is diminished due to motion artifact and suboptimal patient positioning.  There is a hyperattenuating mass  overlying the left frontal cerebral convexity compatible with acute hematoma.  This measures approximately 2.7 cm, image 14/series 5. There is likely both cerebral dural and subarachnoid component to this hematoma.  A second area of hemorrhage is involving the left occipital lobe with both small subdural and subarachnoid components.  Hyper attenuating focus within the occipital horn of the left lateral ventricle is noted measuring 9 mm, image 6/series 5.  This is new from the previous MRI from 06/03/2006.  There is diffuse patchy low density throughout the subcortical and periventricular white matter consistent with chronic small vessel ischemic change.  There is prominence of the sulci and ventricles consistent with brain atrophy.  There is no evidence for acute brain infarct, hemorrhage or mass.  The paranasal sinuses appear clear.  There is opacification of both mastoid air cells.  The skull appears intact.  Within the occipital horn  IMPRESSION:  1.  Exam detail is significantly diminished due to motion artifact and patient  positioning. 2.  There are least two areas of subarachnoid and subdural hematoma involving the left frontal lobe and left occipital lobe.  Improved definition may be obtained with repeat imaging without motion artifact. 3.  Suspect intraventricular hemorrhage involving the occipital horn of the left lateral ventricle. 4.  Small vessel ischemic change and brain atrophy.  CT CERVICAL SPINE  Findings: Image detail is diminished due to motion artifact.  There is an anterolisthesis of C4 on C5.  There is multilevel disc space narrowing and ventral endplate spurring identified throughout the cervical spine.  No fractures are identified.  IMPRESSION:  1.  Exam detail is diminished secondary to motion artifact. 2.  No fracture identified. 3.  Advanced cervical spondylosis noted.  Critical Value/emergent results were called by telephone at the time of interpretation on 12/30/2012. at 6:30 p.m. to Dr. Karma Ganja, who verbally acknowledged these results.   Original Report Authenticated By: Signa Kell, M.D.   Ct Cervical Spine Wo Contrast  12/30/2012   *RADIOLOGY REPORT*  Clinical Data:  Fall, laceration and aggressive behavior.  Repeat scans following administration of sedative.  CT HEAD WITHOUT CONTRAST CT CERVICAL SPINE WITHOUT CONTRAST  Technique:  Multidetector CT imaging of the head and cervical spine was performed following the standard protocol without intravenous contrast.  Multiplanar CT image reconstructions of the cervical spine were also generated.  Comparison:  CT head and cervical spine obtained earlier today at 18 08:00 p.m.; prior MRI of the brain 06/03/2006  CT HEAD  Findings: Again, significantly limited examination due to extreme patient motion.  Images are not appreciably better than the first scan attempts.  Again, there is subarachnoid hemorrhage and / or hemorrhagic contusion involving the posterior left frontal convexity at the vertex, as well as overlying the left occipital lobe.  Focal rounded high  attenuation within the left occipital horn may represent a small amount of intraventricular hemorrhage. Background of atrophy and chronic microvascular ischemic white matter changes is similar compared to prior.  No new hydrocephalus, or midline shift.  Soft tissue contusion overlying the occipital scalp.  No definite calvarial fracture identified within the limitations of this motion degraded exam.  Chronic opacification of the left greater than right mastoid air cells.  IMPRESSION:  1.  Significantly limited exam due to extreme patient motion. Overall, images are similar compared to earlier this evening. 2.  Subarachnoid hemorrhage and / or hemorrhagic contusion overlying the posterior left frontal lobe at the vertex, and overlying the left occipital lobe. 3.  Rounded high  attenuation within the occipital horn of the left lateral ventricle favored to reflect a small amount of hemorrhage. 4.  Soft tissue laceration overlying the occiput without convincing evidence of associated fracture within the limitations of this study. 5.  Background atrophy and microvascular ischemic white matter disease.  CT CERVICAL SPINE  Findings: Exam detail significantly limited by patient motion. Images are slightly improved compared to those obtained earlier today.  The dens appears grossly intact.  There is extensive multilevel cervical spondylosis and degenerative disc disease.  No definite displaced fracture identified.  Stable trace likely degenerative anterolisthesis of C4-C5.  No prevertebral soft tissue swelling.  IMPRESSION:  1. Slightly improved exam detail with some persistent diminution related to patient motion. 2.  No acute fracture or malalignment identified. 3.  Advanced multilevel cervical spondylosis.   Original Report Authenticated By: Malachy Moan, M.D.   Ct Cervical Spine Wo Contrast  12/30/2012   *RADIOLOGY REPORT*  Clinical Data:  Status post fall.  CT HEAD WITHOUT CONTRAST CT CERVICAL SPINE WITHOUT  CONTRAST  Technique:  Multidetector CT imaging of the head and cervical spine was performed following the standard protocol without intravenous contrast.  Multiplanar CT image reconstructions of the cervical spine were also generated.  Comparison:  Brain MRI from 06/03/2006  CT HEAD  Findings: Exam detail is diminished due to motion artifact and suboptimal patient positioning.  There is a hyperattenuating mass overlying the left frontal cerebral convexity compatible with acute hematoma.  This measures approximately 2.7 cm, image 14/series 5. There is likely both cerebral dural and subarachnoid component to this hematoma.  A second area of hemorrhage is involving the left occipital lobe with both small subdural and subarachnoid components.  Hyper attenuating focus within the occipital horn of the left lateral ventricle is noted measuring 9 mm, image 6/series 5.  This is new from the previous MRI from 06/03/2006.  There is diffuse patchy low density throughout the subcortical and periventricular white matter consistent with chronic small vessel ischemic change.  There is prominence of the sulci and ventricles consistent with brain atrophy.  There is no evidence for acute brain infarct, hemorrhage or mass.  The paranasal sinuses appear clear.  There is opacification of both mastoid air cells.  The skull appears intact.  Within the occipital horn  IMPRESSION:  1.  Exam detail is significantly diminished due to motion artifact and patient positioning. 2.  There are least two areas of subarachnoid and subdural hematoma involving the left frontal lobe and left occipital lobe.  Improved definition may be obtained with repeat imaging without motion artifact. 3.  Suspect intraventricular hemorrhage involving the occipital horn of the left lateral ventricle. 4.  Small vessel ischemic change and brain atrophy.  CT CERVICAL SPINE  Findings: Image detail is diminished due to motion artifact.  There is an anterolisthesis of C4 on  C5.  There is multilevel disc space narrowing and ventral endplate spurring identified throughout the cervical spine.  No fractures are identified.  IMPRESSION:  1.  Exam detail is diminished secondary to motion artifact. 2.  No fracture identified. 3.  Advanced cervical spondylosis noted.  Critical Value/emergent results were called by telephone at the time of interpretation on 12/30/2012. at 6:30 p.m. to Dr. Karma Ganja, who verbally acknowledged these results.   Original Report Authenticated By: Signa Kell, M.D.    Assessment/Plan 79 year old SNF resident with advanced dementia who incurred a fall today at his facility.  On evaluation underwent CT imaging.  CT reviewed and  although of poor quality secondary to patient cooperation seems to show evidence of subdural and subarachnoid hemorrhage.  This is all likely related to the trauma from the fall.  Neurosurgery has reviewed the films and does not feel that surgical intervention is warranted at this time.  Patient is a DNR.  Recommendations: 1.  Admit to step-down with neurochecks q 4 hours 2.  Repeat head CT in AM 3.  Foley catheter 4.  CBC in AM  Thana Farr, MD [facility] [phone] 12/31/2012, 12:47 AM

## 2012-12-31 NOTE — Care Management Note (Signed)
    Page 1 of 1   12/31/2012     1:53:04 PM   CARE MANAGEMENT NOTE 12/31/2012  Patient:  [name], [name]   Account Number:  [id number]  Date Initiated:  12/31/2012  Documentation initiated by:  Donn Pierini  Subjective/Objective Assessment:   Pt admitted s/p fall with SDH     Action/Plan:   PTA  pt lived at [facility]- memory care unit- CSW consulted for return to [facility]   Anticipated DC Date:  12/31/2012   Anticipated DC Plan:  SKILLED NURSING FACILITY  In-house referral  Clinical Social Worker      DC Planning Services  CM consult      Choice offered to / List presented to:             Status of service:  Completed, signed off Medicare Important Message given?   (If response is "NO", the following Medicare IM given date fields will be blank) Date Medicare IM given:   Date Additional Medicare IM given:    Discharge Disposition:  SKILLED NURSING FACILITY  Per UR Regulation:  Reviewed for med. necessity/level of care/duration of stay  If discussed at Long Length of Stay Meetings, dates discussed:    Comments:  12/31/12- 1350- Donn Pierini RN, BSN [phone] Pt for d/c back to SNF today- CSW following for return to [facility]

## 2012-12-31 NOTE — Progress Notes (Signed)
Subjective: Remains confused and agitated. No overnight adverse events reported.  Objective: Current vital signs: BP 129/55  Pulse 91  Temp(Src) 99.8 F (37.7 C) (Axillary)  Resp 14  Wt 65.6 kg (144 lb 10 oz)  BMI 24.81 kg/m2  SpO2 99%  Neurologic Exam: Alert and in no acute distress. Patient was quite confused and moderately agitated. He tended to perseverate with verbal responses. He could follow simple commands intermittently. No facial weakness was noted. His extraocular movements were full on right and left lateral gaze. Moved extremities equally with equal strength throughout. Deep tendon reflexes were trace to 1+ throughout and symmetrical. Plantar responses were mute bilaterally.  Medications: I have reviewed the patient's current medications.  Assessment/Plan: Intracerebral and subarachnoid hemorrhages involving left frontal and left occipital regions secondary to closed head trauma, stable per repeat CT scan earlier today. Patient has known profound dementia and is a resident of a local skilled nursing facility.  No further neurodiagnostic studies nor neurologic intervention indicated at this point. Patient may be discharged back to his care facility. We will gladly see him in followup, however, if indicated during this admission.  C.R. Roseanne Reno, MD [facility] [phone]  12/31/2012  11:54 AM

## 2012-12-31 NOTE — Discharge Summary (Signed)
Physician Discharge Summary   Patient ID: [name] [id number] 79 y.o. Apr 16, 1923  Admit date: 12/30/2012  Discharge date and time: 12/31/2012, 2:02 PM  Admitting Physician: Thana Farr, MD   Discharge Physician: Dr. Roseanne Reno.   Admission Diagnoses: Intracranial bleed [432.9] Fall, initial encounter [E888.9] Scalp laceration, initial encounter [873.0]  Discharge Diagnoses: Intracranial bleed [432.9] Fall, initial encounter [E888.9] Scalp laceration, initial encounter [873.0]  Admission Condition: stable  Discharged Condition: stable  Indication for Admission: Fall with resulting Intracerebral and subarachnoid hemorrhages involving left frontal and left occipital regions  Hospital Course: Patient was admitted for observation and had no events over night. Patient remains confused but has baseline dementia and has had no changes in mental status from his baseline. Patient remained stable and is ready to be discharged back to SNF.    Consults: None  Significant Diagnostic Studies: radiology:  1) CT scan Head X 2: Intracerebral and subarachnoid hemorrhages involving left frontal and left occipital regions secondary to closed head trauma, stable per repeat CT  2) CT neck: No acute fracture or malalignment identified.  Treatments: IV hydration and therapies: PT  Discharge Exam:   Alert and in no acute distress. Patient was quite confused and moderately agitated. He tended to perseverate with verbal responses. He could follow simple commands intermittently.  No facial weakness was noted.  His extraocular movements were full on right and left lateral gaze.  Moved extremities equally with equal strength throughout.  Deep tendon reflexes were trace to 1+ throughout and symmetrical.  Plantar responses were mute bilaterally.   Disposition: 01-Home or Self Care  Patient Instructions:    Medication List    STOP taking these medications       aspirin 81 MG tablet       TAKE these medications       cholecalciferol 1000 UNITS tablet  Commonly known as:  VITAMIN D  Take 2,000 Units by mouth daily. Take one tablet daily for vitamin d supplement.     citalopram 20 MG tablet  Commonly known as:  CELEXA  Take 10 mg by mouth every other day. Take one tablet  daily to help anxiety and depression.     enalapril 10 MG tablet  Commonly known as:  VASOTEC  Take 10 mg by mouth daily. Take one tablet daily to treat HTN.     finasteride 5 MG tablet  Commonly known as:  PROSCAR  Take 5 mg by mouth daily. Take 1 tablet once daily to help prostate.     ipratropium-albuterol 0.5-2.5 (3) MG/3ML Soln  Commonly known as:  DUONEB  Take 3 mLs by nebulization every 8 (eight) weeks. Use in nebulizer every 8 hours as needed to help breathing.     mirtazapine 30 MG tablet  Commonly known as:  REMERON  Take 30 mg by mouth at bedtime. Take 1 tablet  at bedtime to treat depression,anxiety, increase appetite.     polyethylene glycol packet  Commonly known as:  MIRALAX / GLYCOLAX  Take 17 g by mouth daily. Give 17 gram daily in 8 oz water or juice to prevent constipation.       Activity: activity as tolerated Diet: regular diet Wound Care: none needed  Follow-up with neurosurgery with family to call and make appointment.   Felicie Morn PA-C [facility] [phone]  12/31/2012, 2:02 PM

## 2012-12-31 NOTE — Progress Notes (Signed)
OT Cancellation Note  Patient Details Name: [name] MRN: [id number] DOB: [birth year]   Cancelled Treatment:    Reason Eval/Treat Not Completed: Other (comment) Pt to D/C back to SNF. Will defer OT to SNF.  [facility] Yug Loria, OTR/L  [phone] 12/31/2012 12/31/2012, 2:11 PM

## 2012-12-31 NOTE — Progress Notes (Signed)
Physical Therapy Evaluation Patient Details Name: [name] MRN: [id number] DOB: Feb 01, 1923 Today's Date: 12/31/2012 Time: 4098-1191 PT Time Calculation (min): 22 min  PT Assessment / Plan / Recommendation History of Present Illness  Pt admit after fall at [facility].  Laceration on head with SDH and SAH.  Clinical Impression  Pt admitted with above. Pt currently with functional limitations due to the deficits listed below (see PT Problem List). Will need therapy at [facility] upon initial return. Pt will benefit from skilled PT to increase their independence and safety with mobility to allow discharge to the venue listed below.    PT Assessment  Patient needs continued PT services    Follow Up Recommendations  SNF;Supervision/Assistance - 24 hour        Barriers to Discharge Decreased caregiver support      Equipment Recommendations  Other (comment) (TBA)         Frequency Min 2X/week    Precautions / Restrictions Precautions Precautions: Fall Restrictions Weight Bearing Restrictions: No   Pertinent Vitals/Pain VSS, no pain      Mobility  Bed Mobility Bed Mobility: Rolling Right;Right Sidelying to Sit;Sitting - Scoot to [place] of Bed;Sit to Supine Rolling Right: 5: Supervision Right Sidelying to Sit: 4: Min guard Sitting - Scoot to [place] of Bed: 4: Min guard Sit to Supine: 1: +2 Total assist;HOB flat Sit to Supine: Patient Percentage: 40% Details for Bed Mobility Assistance: On arrival, pt in hand mitts.  Pt was attempting to throw his right LE off of bed.  Assisted pt to [facility] pt assisting quite a bit.  Pt constantly repeating "ham sam" and trying to remove hand mitts.  Pt appeared taht he was going to work with PT and get up.  Once at EOB, tried to get pt to stand up but he would not.  Obtained RW and placed in front of pt and removed mitts however pt did not try to stand and was then trying to hold onto mitts and would not use his hands to stand up.  After a few more  attempts with pt becoming more agitated, PT replaced hand mitts and assisted pt to supine position.   Transfers Transfers: Not assessed Ambulation/Gait Ambulation/Gait Assistance: Not tested (comment) Stairs: No Wheelchair Mobility Wheelchair Mobility: No Modified Rankin (Stroke Patients Only) Pre-Morbid Rankin Score: Moderately severe disability Modified Rankin: Severe disability         PT Diagnosis: Generalized weakness  PT Problem List: Decreased activity tolerance;Decreased balance;Decreased mobility;Decreased cognition;Decreased knowledge of use of DME;Decreased safety awareness;Decreased knowledge of precautions PT Treatment Interventions: DME instruction;Gait training;Functional mobility training;Therapeutic activities;Therapeutic exercise;Balance training;Neuromuscular re-education;Cognitive remediation;Patient/family education     PT Goals(Current goals can be found in the care plan section) Acute Rehab PT Goals Patient Stated Goal: unable to state PT Goal Formulation: Patient unable to participate in goal setting Time For Goal Achievement: 01/14/13 Potential to Achieve Goals: Fair  Visit Information  Last PT Received On: 12/31/12 Assistance Needed: +2 History of Present Illness: Pt admit after fall at [facility].  Laceration on head with SDH.       Prior Functioning  Home Living Family/patient expects to be discharged to:: Skilled nursing facility Additional Comments: Lives at [facility] in dementia unit Prior Function Level of Independence: Independent with assistive device(s) Comments: Per nurse, ambulated on unit at [facility] independently Communication Communication: Expressive difficulties    Cognition  Cognition Arousal/Alertness: Awake/alert Behavior During Therapy: Agitated;Impulsive Overall Cognitive Status: History of cognitive impairments - at baseline Memory: Decreased recall  of precautions;Decreased short-term memory    Extremity/Trunk Assessment Upper  Extremity Assessment Upper Extremity Assessment: Defer to OT evaluation Lower Extremity Assessment Lower Extremity Assessment: Difficult to assess due to impaired cognition Cervical / Trunk Assessment Cervical / Trunk Assessment: Kyphotic   Balance Balance Balance Assessed: Yes Static Sitting Balance Static Sitting - Balance Support: No upper extremity supported;Feet supported Static Sitting - Level of Assistance: 5: Stand by assistance Static Sitting - Comment/# of Minutes: 2  End of Session PT - End of Session Equipment Utilized During Treatment: Gait belt Activity Tolerance: Treatment limited secondary to agitation Patient left: in bed;with bed alarm set;with call bell/phone within reach;with restraints reapplied Nurse Communication: Mobility status;Need for lift equipment  GP Functional Assessment Tool Used: clinical judgment Functional Limitation: Mobility: Walking and moving around Mobility: Walking and Moving Around Current Status [phone]): At least 40 percent but less than 60 percent impaired, limited or restricted Mobility: Walking and Moving Around Goal Status [phone]): At least 1 percent but less than 20 percent impaired, limited or restricted   [name],[name] 12/31/2012, 11:54 AM [facility] [phone] (pager)

## 2012-12-31 NOTE — Progress Notes (Signed)
Called report to [facility], gave report to [place], [place]. All belongings sent with pt, son at bedside and aware of transfer back to pt's regular SNF. Will be transported via ambulance.   Delynn Flavin, RN

## 2012-12-31 NOTE — Clinical Social Work Psychosocial (Addendum)
Clinical Social Work Department BRIEF PSYCHOSOCIAL ASSESSMENT 12/31/2012  Patient:  [name], [name]     Account Number:  [id number]     Admit date:  12/30/2012  Clinical Social Worker:  Delmer Islam  Date/Time:  12/31/2012 03:54 AM  Referred by:  Physician  Date Referred:  12/31/2012 Referred for  SNF Placement   Other Referral:   Interview type:  Other - See comment Other interview type:   Colbert Ewing, staff person at [facility], with healthcare servcies    PSYCHOSOCIAL DATA Living Status:  FACILITY Admitted from facility:  [facility] Level of care:  Skilled Nursing Facility Primary support name:  [name] Primary support relationship to patient:  CHILD, ADULT Degree of support available:   Patient also has a son [name]. Son was at the bedside wtih patient.    CURRENT CONCERNS Current Concerns  Post-Acute Placement   Other Concerns:    SOCIAL WORK ASSESSMENT / PLAN CSW advised by nurse that patient medically stable for return to [facility]. Call made to [facility] with [facility] and advised that  patient ready to return. Clinicals forwarded to facility. Son at the bedside and is aware of discharge.   Assessment/plan status:  No Further Intervention Required Other assessment/ plan:   Information/referral to community resources:    PATIENT'S/FAMILY'S RESPONSE TO PLAN OF CARE: Facility and family aware of patient's discharge back to facility today. DISCHARGE NOTE: Patient returning to [facility] today. Discharge clinicals forwarded to facility and CSW facilitated transport back to facility via ambulance.

## 2013-01-01 ENCOUNTER — Non-Acute Institutional Stay (SKILLED_NURSING_FACILITY): Payer: Medicare Other | Admitting: Nurse Practitioner

## 2013-01-01 DIAGNOSIS — I10 Essential (primary) hypertension: Secondary | ICD-10-CM

## 2013-01-01 DIAGNOSIS — K5900 Constipation, unspecified: Secondary | ICD-10-CM

## 2013-01-01 DIAGNOSIS — R413 Other amnesia: Secondary | ICD-10-CM

## 2013-01-01 DIAGNOSIS — S0101XD Laceration without foreign body of scalp, subsequent encounter: Secondary | ICD-10-CM

## 2013-01-01 DIAGNOSIS — Z5189 Encounter for other specified aftercare: Secondary | ICD-10-CM

## 2013-01-01 DIAGNOSIS — F329 Major depressive disorder, single episode, unspecified: Secondary | ICD-10-CM

## 2013-01-01 DIAGNOSIS — R350 Frequency of micturition: Secondary | ICD-10-CM

## 2013-01-01 DIAGNOSIS — R4701 Aphasia: Secondary | ICD-10-CM

## 2013-01-01 NOTE — Progress Notes (Signed)
Patient ID: [name], male   DOB: 05/19/1922, 79 y.o.   MRN: [id number] Code Status: DNR  Allergies  Allergen Reactions  . Aricept [Donepezil Hcl]   . Galantamine Hydrobromide Nausea Only  . Namenda [Memantine Hcl] Nausea And Vomiting  . Penicillins Nausea Only    Chief Complaint  Patient presents with  . Medical Managment of Chronic Issues    occipital laceration with staple closure sustained from fall, s/p overnight observation in ED    HPI: Patient is a 79 y.o. male seen in the Memory Care Unit at [facility] today for f/u s/p fall, intracranial bleed, occipital laceration,  and other chronic medical conditions. Overnight stay at [facility] 12/30/12-12/31/12 after  Fall with resulting Intracerebral and subarachnoid hemorrhages involving left frontal and left occipital regions. Patient was admitted for observation and had no events over night. Patient remains confused but has baseline dementia and has had no changes in mental status from his baseline. Occipital lacerated was closed with 13 staples. Had 1) CT scan Head X 2: Intracerebral and subarachnoid hemorrhages involving left frontal and left occipital regions secondary to closed head trauma, stable per repeat CT  2) CT neck: No acute fracture or malalignment identified.   Problem List Items Addressed This Visit   Aphasia     Unchanged, minimal communication skills. Mumbled during today's visit.       Depressive disorder, not elsewhere classified     No report of problems with mood or behavior. MDS reviewed from April 2014 PHQ-9-OV unchanged at 3/30. Continue  Celexa and mirtazapine, will attempt dose reduction of Celexa.      HTN (hypertension)     Controlled on Enalapril 10mg      Memory loss     MDS 07/2012:  BIMS not performed, staff assessment reveals severe cognitive impairment; language/general communication severely impaired.PHQ-9OV 3/30. Functional status is unchanged. Continues to be an elopement risk.  Patient is  assisted off unit for walks, this deters his desire to leave the unaccompanied.      Occipital scalp laceration     Sustained from the fall 12/30/12 at Memory Care Unit [facility]. 13 staple closure intact and no s/s peri-wound cellulitis. Will schedule staple removal 02/04/13    Subarachnoid hemorrhage following injury, without mention of open intracranial wound, no loss of consciousness - Primary     Intracerebral and subarachnoid hemorrhages involving left frontal and left occipital regions secondary to closed head trauma, stable per repeat CT scan earlier today. Patient has known profound dementia and is a resident of Memory Care Unit at [facility]. No further neurodiagnostic studies nor neurologic intervention indicated at this point per Dr. Roseanne Reno Neurology. ASA was discontinued while in hospital.       Unspecified constipation     No problem while taking Miralax daily.     Urinary frequency     No sure Finasteride helps with urinary frequency/leakage, but at least he has no urinary retention presently.        Review of Systems:  Review of Systems  Constitutional: Positive for weight loss and malaise/fatigue. Negative for fever, chills and diaphoresis.  HENT: Positive for hearing loss. Negative for ear pain, congestion, neck pain, tinnitus and ear discharge.   Eyes: Negative for blurred vision, double vision, photophobia, pain, discharge and redness.  Respiratory: Negative for cough, hemoptysis, sputum production, shortness of breath, wheezing and stridor.   Cardiovascular: Negative for chest pain, palpitations, orthopnea, claudication, leg swelling and PND.  Gastrointestinal: Negative for heartburn, nausea, vomiting, abdominal  pain, diarrhea, constipation and blood in stool.  Genitourinary: Positive for frequency. Negative for dysuria, urgency, hematuria and flank pain.  Musculoskeletal: Positive for falls. Negative for myalgias, back pain and joint pain.  Skin: Negative for  itching and rash.       Occipital laceration with 13 staple closure intact and no s/s of infection.   Neurological: Negative for dizziness, tingling, tremors, sensory change, speech change, focal weakness, seizures, weakness and headaches.  Endo/Heme/Allergies: Negative for environmental allergies. Does not bruise/bleed easily.  Psychiatric/Behavioral: Positive for depression and memory loss. Negative for suicidal ideas, hallucinations and substance abuse. The patient is not nervous/anxious and does not have insomnia.      Past Medical History  Diagnosis Date  . Loss of weight 12/05/2011  . Pain in joint, shoulder region 02/05/2011  . Neuralgia, neuritis, and radiculitis, unspecified 12/11/2010  . Unspecified venous (peripheral) insufficiency 09/01/2008  . Unspecified hypothyroidism 04/05/2008  . Rickets, active 04/05/2008  . Nocturia 11/25/2006  . Aphasia 05/27/2006  . Unspecified persistent mental disorders due to conditions classified elsewhere 12/10/2005  . 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 Unspecified constipation 12/10/2005  . Other and unspecified hyperlipidemia 07/25/2005  . Tension headache 07/25/2005  . Occlusion and stenosis of carotid artery without mention of cerebral infarction 07/16/2005  . Memory loss 06/25/2005  . Unspecified hearing loss 08/16/2004  . Insomnia, unspecified 07/05/2004  . Allergic rhinitis due to pollen 01/05/2004  . Depressive disorder, not elsewhere classified 12/22/2003  . Unspecified essential hypertension 12/22/2003  . Inguinal hernia without mention of obstruction or gangrene, unilateral or unspecified, (not specified as recurrent) 12/22/2003  . Diaphragmatic hernia without mention of obstruction or gangrene 12/22/2003  . Diverticulosis of colon (without mention of hemorrhage) 12/22/2003`  . Osteoarthrosis, unspecified whether generalized or localized, unspecified site 12/22/2003  . Scoliosis (and kyphoscoliosis), idiopathic 12/22/2003   . Edema 12/22/2003  . Hypertrophy of prostate without urinary obstruction and other lower urinary tract symptoms (LUTS) 12/30/2002  . Osteoporosis, unspecified 08/21/2002  . Loss of weight    Past Surgical History  Procedure Laterality Date  . Tonsillectomy  1930  . Carotid endarterectomy Left 07/2005    with Dacron patch angioplasty  . Colonoscopy  2006    no polyps   Social History:   reports that he has never smoked. He has never used smokeless tobacco. He reports that he does not drink alcohol or use illicit drugs.  Family History  Problem Relation Age of Onset  . Cancer Mother   . Cancer Father     emphysema    Medications: Patient's Medications  New Prescriptions   No medications on file  Previous Medications   CHOLECALCIFEROL (VITAMIN D) 1000 UNITS TABLET    Take 2,000 Units by mouth daily. Take one tablet daily for vitamin d supplement.   CITALOPRAM (CELEXA) 20 MG TABLET    Take 10 mg by mouth every other day. Take one tablet  daily to help anxiety and depression.   ENALAPRIL (VASOTEC) 10 MG TABLET    Take 10 mg by mouth daily. Take one tablet daily to treat HTN.   FINASTERIDE (PROSCAR) 5 MG TABLET    Take 5 mg by mouth daily. Take 1 tablet once daily to help prostate.   IPRATROPIUM-ALBUTEROL (DUONEB) 0.5-2.5 (3) MG/3ML SOLN    Take 3 mLs by nebulization every 8 (eight) weeks. Use in nebulizer every 8 hours as needed to help breathing.   MIRTAZAPINE (REMERON) 30 MG TABLET    Take 30 mg  by mouth at bedtime. Take 1 tablet  at bedtime to treat depression,anxiety, increase appetite.   POLYETHYLENE GLYCOL (MIRALAX / GLYCOLAX) PACKET    Take 17 g by mouth daily. Give 17 gram daily in 8 oz water or juice to prevent constipation.  Modified Medications   No medications on file  Discontinued Medications   No medications on file     Physical Exam: Physical Exam  Nursing note and vitals reviewed. Constitutional: He appears well-developed and well-nourished. No distress.   HENT:  Head: Normocephalic and atraumatic.  Right Ear: External ear normal.  Left Ear: External ear normal.  Nose: Nose normal.  Mouth/Throat: Oropharynx is clear and moist. No oropharyngeal exudate.  Eyes: Conjunctivae and EOM are normal. Pupils are equal, round, and reactive to light. Right eye exhibits no discharge. Left eye exhibits no discharge. No scleral icterus.  Large pupils  Neck: Normal range of motion. Neck supple. No JVD present. No tracheal deviation present. No thyromegaly present.  Cardiovascular: Normal rate, regular rhythm, normal heart sounds and intact distal pulses.  Exam reveals no friction rub.   No murmur heard. Pulmonary/Chest: Effort normal and breath sounds normal. No stridor. No respiratory distress. He has no wheezes. He has no rales. He exhibits no tenderness.  Abdominal: Soft. Bowel sounds are normal. He exhibits no distension. There is no guarding.  Musculoskeletal: Normal range of motion. He exhibits no edema and no tenderness.  Lymphadenopathy:    He has no cervical adenopathy.  Neurological: He is alert. He displays abnormal reflex. No cranial nerve deficit. He exhibits abnormal muscle tone. Coordination abnormal.  Skin: Skin is warm and dry. No rash noted. He is not diaphoretic. No erythema.  Occipital laceration with 13 staple closures intact and no s/s of infection.   Psychiatric:  Mumbles, doesn't follow commend, no safety awareness,     Filed Vitals:   01/02/13 1432  BP: 111/62  Pulse: 58  Temp: 99.2 F (37.3 C)  TempSrc: Tympanic  Resp: 16      Labs reviewed: Basic Metabolic Panel:  Recent Labs  47/82/95 2150  NA 142  K 3.7  CL 105  CO2 25  GLUCOSE 142*  BUN 16  CREATININE 0.79  CALCIUM 9.7   CBC    Component Value Date/Time   WBC 9.9 12/31/2012 0546   RBC 3.87* 12/31/2012 0546   HGB 12.5* 12/31/2012 0546   HCT 36.0* 12/31/2012 0546   PLT 196 12/31/2012 0546   MCV 93.0 12/31/2012 0546   MCH 32.3 12/31/2012 0546   MCHC  34.7 12/31/2012 0546   RDW 14.0 12/31/2012 0546   LYMPHSABS 3.4 03/30/2007 0931   MONOABS 1.1* 03/30/2007 0931   EOSABS 0.2 03/30/2007 0931   BASOSABS 0.0 03/30/2007 0931     Past Procedures:   12/30/12 CT scan Head X 2: Intracerebral and subarachnoid hemorrhages involving left frontal and left occipital regions secondary to closed head trauma, stable per repeat CT  2) CT neck: No acute fracture or malalignment identified.   Assessment/Plan Subarachnoid hemorrhage following injury, without mention of open intracranial wound, no loss of consciousness Intracerebral and subarachnoid hemorrhages involving left frontal and left occipital regions secondary to closed head trauma, stable per repeat CT scan earlier today. Patient has known profound dementia and is a resident of Memory Care Unit at [facility]. No further neurodiagnostic studies nor neurologic intervention indicated at this point per Dr. Roseanne Reno Neurology. ASA was discontinued while in hospital.     Aphasia Unchanged, minimal communication skills. Mumbled  during today's visit.     Occipital scalp laceration Sustained from the fall 12/30/12 at Memory Care Unit [facility]. 13 staple closure intact and no s/s peri-wound cellulitis. Will schedule staple removal 02/04/13  Memory loss MDS 07/2012:  BIMS not performed, staff assessment reveals severe cognitive impairment; language/general communication severely impaired.PHQ-9OV 3/30. Functional status is unchanged. Continues to be an elopement risk.  Patient is assisted off unit for walks, this deters his desire to leave the unaccompanied.    Depressive disorder, not elsewhere classified No report of problems with mood or behavior. MDS reviewed from April 2014 PHQ-9-OV unchanged at 3/30. Continue  Celexa and mirtazapine, will attempt dose reduction of Celexa.    HTN (hypertension) Controlled on Enalapril 10mg    Unspecified constipation No problem while taking Miralax daily.    Urinary frequency No sure Finasteride helps with urinary frequency/leakage, but at least he has no urinary retention presently.     Family/ Staff Communication: observe the patient. Staple removal 01/05/13  Goals of Care: SNF, Memory Care Unit  Labs/tests ordered: none

## 2013-01-02 ENCOUNTER — Encounter: Payer: Self-pay | Admitting: Nurse Practitioner

## 2013-01-02 DIAGNOSIS — R350 Frequency of micturition: Secondary | ICD-10-CM | POA: Insufficient documentation

## 2013-01-02 DIAGNOSIS — S0101XA Laceration without foreign body of scalp, initial encounter: Secondary | ICD-10-CM | POA: Insufficient documentation

## 2013-01-02 DIAGNOSIS — K5900 Constipation, unspecified: Secondary | ICD-10-CM | POA: Insufficient documentation

## 2013-01-02 DIAGNOSIS — I10 Essential (primary) hypertension: Secondary | ICD-10-CM | POA: Insufficient documentation

## 2013-01-02 NOTE — Assessment & Plan Note (Signed)
Sustained from the fall 12/30/12 at Memory Care Unit [facility]. 13 staple closure intact and no s/s peri-wound cellulitis. Will schedule staple removal 02/04/13

## 2013-01-02 NOTE — Assessment & Plan Note (Signed)
No sure Finasteride helps with urinary frequency/leakage, but at least he has no urinary retention presently.

## 2013-01-02 NOTE — Assessment & Plan Note (Signed)
MDS 07/2012:  BIMS not performed, staff assessment reveals severe cognitive impairment; language/general communication severely impaired.PHQ-9OV 3/30. Functional status is unchanged. Continues to be an elopement risk.  Patient is assisted off unit for walks, this deters his desire to leave the unaccompanied.

## 2013-01-02 NOTE — Assessment & Plan Note (Signed)
No problem while taking Miralax daily.

## 2013-01-02 NOTE — Assessment & Plan Note (Signed)
No report of problems with mood or behavior. MDS reviewed from April 2014 PHQ-9-OV unchanged at 3/30. Continue  Celexa and mirtazapine, will attempt dose reduction of Celexa.

## 2013-01-02 NOTE — Assessment & Plan Note (Addendum)
Intracerebral and subarachnoid hemorrhages involving left frontal and left occipital regions secondary to closed head trauma, stable per repeat CT scan earlier today. Patient has known profound dementia and is a resident of Memory Care Unit at [facility]. No further neurodiagnostic studies nor neurologic intervention indicated at this point per Dr. Roseanne [facility]. ASA was discontinued while in hospital.

## 2013-01-02 NOTE — Assessment & Plan Note (Signed)
Controlled on Enalapril 10mg 

## 2013-01-02 NOTE — Assessment & Plan Note (Signed)
Unchanged, minimal communication skills. Mumbled during today's visit.

## 2013-01-05 ENCOUNTER — Non-Acute Institutional Stay (SKILLED_NURSING_FACILITY): Payer: Medicare Other | Admitting: Geriatric Medicine

## 2013-01-05 ENCOUNTER — Encounter: Payer: Self-pay | Admitting: Geriatric Medicine

## 2013-01-05 DIAGNOSIS — Z5189 Encounter for other specified aftercare: Secondary | ICD-10-CM

## 2013-01-05 DIAGNOSIS — R569 Unspecified convulsions: Secondary | ICD-10-CM

## 2013-01-05 DIAGNOSIS — S0101XD Laceration without foreign body of scalp, subsequent encounter: Secondary | ICD-10-CM

## 2013-01-05 NOTE — Progress Notes (Signed)
Patient ID: KAHIL AGNER, male   DOB: 05-Jun-1922, 79 y.o.   MRN: [id number] [facility] [phone])  Code Status: DNR Contact Information   Name Relation Home Work Mobile   [place] Son [phone]     Bracy, Pepper [phone]        Chief Complaint  Patient presents with  . Seizures  . Head Laceration    HPI: This  79 y.o. male resident of [facility], Memory Care section is evaluated today in followup of head laceration sustained in a fall last week as well as new onset seizures. This patient had a fall on September 16 with large laceration to the occiput area of his skull. He was sent to Emergency Department for care this laceration, while there underwent a CT scan of the head which revealed small intracerebral and subarachnoid bleeds. He was admitted to hospital overnight and then discharged back to the Memory Care section at [facility] on 12/31/2012.  Scalp laceration was repaired with 13 skin staples.This wound has remained clean and dry though the patient does pick at it intermittently.  Staple removal has been recommended for today In the evening hours of September 19, nurse noted patient with total body jerking, very shallow breathing, eyes appeared to be unfocused. This activity lasted "several minutes".  On-call provider was notified as well as patient's family members. ED evaluation was declined. This episode is being treated as probable seizure activity, PO Dilantin was started 01/03/2013. Patient has been accepting this medication most of the time. There's been no further seizure activity noted. Patient's general demeanor/activity has returned to his baseline; awake, alert ambulating with supervision. Requires assistance with all ADLs including eating though frequently refuses assistance. He has very limited communication skills due to profound hearing loss and dementia.    Allergies  Allergen Reactions  . Aricept [Donepezil Hcl]   .  Galantamine Hydrobromide Nausea Only  . Namenda [Memantine Hcl] Nausea And Vomiting  . Penicillins Nausea Only   Medications Reviewed  DATA REVIEWED  Radiologic Exams   12/30/12 CT scan Head X 2: Intracerebral and subarachnoid hemorrhages involving left frontal and left occipital regions secondary to closed head trauma, stable per repeat CT  2) CT neck: No acute fracture or malalignment identified.   Cardiovascular Exams:   Laboratory Studies  Hosp. Lab Lab Results  Component Value Date   WBC 9.9 12/31/2012   HGB 12.5* 12/31/2012   HCT 36.0* 12/31/2012   PLT 196 12/31/2012        GLUCOSE 142* 12/30/2012   NA 142 12/30/2012   K 3.7 12/30/2012   CL 105 12/30/2012   CREATININE 0.79 12/30/2012   BUN 16 12/30/2012   CO2 25 12/30/2012    Review of Systems   DATA OBTAINED: from nurse, medical record GENERAL:  No fevers, fatigue, improving appetite   SKIN: No itch, rash  Posterior scalp wound EYES: No eye pain, dryness or itching   EARS: No earache, change in hearing DEAF NOSE: No congestion, drainage or bleeding MOUTH/THROAT: No mouth or tooth pain  No sore throat   No difficulty chewing or swallowing RESPIRATORY: No cough, wheezing, SOB CARDIAC: No chest pain,   No edema. GI: No abdominal pain  No N/V/D or constipation   GU: No dysuria, frequency or urgency  No change in urine volume or character   MUSCULOSKELETAL: No joint pain, swelling or stiffness  No back pain  No muscle ache, pain, weakness  Gait is steady Last Fall 12/30/2012  NEUROLOGIC: No fainting, headache - See HPI PSYCHIATRIC: No signs of increased anxiety, depression Sleeps well.  frequently difficult in assisting with personal care   Physical Exam Filed Vitals:   01/05/13 1417  BP: 119/53  Pulse: 58  Temp: 98.2 F (36.8 C)  Resp: 20  SpO2: 93%    GENERAL APPEARANCE: No acute distress, appropriately groomed, normal body habitus. Alert, pleasant, conversation is impossible due to hearing loss and dementia.  Limited exam today HEAD: Normocephalic, posterior scalp wound with skin edges approximated well with skin staples. Wound is clean and dry. 6 staples removed from wound today. EYES: Conjunctiva/lids clear. Marland Kitchen   EARS: DEAF NOSE: No deformity or discharge. MOUTH/THROAT: Lips w/o lesions. Oral mucosa, tongue moist, w/o lesion.   RESPIRATORY: Breathing is even, unlabored. Lung sounds are clear and full.   CARDIOVASCULAR: Heart RRR. No murmur or extra heart sounds             EDEMA: Trace peripheral  edema.   MUSCULOSKELETAL: Moves all extremities with full ROM, strength and tone. Back is kyphotic, no scoliosis or spinal process tenderness. Gait is steady NEUROLOGIC: Unable to assess orientation, speech unclear, language is impaired,  no tremor.    PSYCHIATRIC: Initially friendly and does not like exam.  ASSESSMENT/PLAN  Seizures Single episode of total body jerking with shallow breathing and apparently on focused gaze. Has been assumed to be seizure activity in setting of recent head injury and dementia. Patient is tolerating p.o. Dilantin, will obtain level. Continue medication and close monitoring.  Occipital scalp laceration Wound is healing well though staples not quite ready to be removed completely; patient is frequently picking at the wound. Have removed 6 staples today, will leave the others in place for now.    Follow up: As needed  Zasha Belleau T.Osiris Charles, NP-C 01/05/2013

## 2013-01-09 ENCOUNTER — Encounter: Payer: Self-pay | Admitting: Geriatric Medicine

## 2013-01-09 DIAGNOSIS — R569 Unspecified convulsions: Secondary | ICD-10-CM

## 2013-01-09 HISTORY — DX: Unspecified convulsions: R56.9

## 2013-01-09 NOTE — Assessment & Plan Note (Signed)
Single episode of total body jerking with shallow breathing and apparently on focused gaze. Has been assumed to be seizure activity in setting of recent head injury and dementia. Patient is tolerating p.o. Dilantin, will obtain level. Continue medication and close monitoring.

## 2013-01-09 NOTE — Assessment & Plan Note (Signed)
Wound is healing well though staples not quite ready to be removed completely; patient is frequently picking at the wound. Have removed 6 staples today, will leave the others in place for now.

## 2013-02-05 LAB — CBC AND DIFFERENTIAL
Hemoglobin: 11 g/dL — AB (ref 13.5–17.5)
Platelets: 214 10*3/uL (ref 150–399)
WBC: 6.1 10^3/mL

## 2013-02-06 ENCOUNTER — Encounter: Payer: Self-pay | Admitting: Geriatric Medicine

## 2013-03-17 ENCOUNTER — Non-Acute Institutional Stay (SKILLED_NURSING_FACILITY): Payer: Medicare Other | Admitting: Geriatric Medicine

## 2013-03-17 DIAGNOSIS — I10 Essential (primary) hypertension: Secondary | ICD-10-CM

## 2013-03-17 DIAGNOSIS — R569 Unspecified convulsions: Secondary | ICD-10-CM

## 2013-03-17 DIAGNOSIS — F0280 Dementia in other diseases classified elsewhere without behavioral disturbance: Secondary | ICD-10-CM

## 2013-03-17 NOTE — Assessment & Plan Note (Signed)
Patient with increased activity and aggressiveness recently. This is likely due at least in part to be disturbed environment related to construction. He does not taking scheduled medications all the time, p.r.n. Ativan is sedating however does cause gait disturbance. Continue to monitor closely, provide supportive environment as able

## 2013-03-17 NOTE — Progress Notes (Signed)
Patient ID: [name], male   DOB: 1923-01-10, 79 y.o.   MRN: [id number]  [facility] [phone])  Code Status: DNR Contact Information   Name Relation Home Work Mobile   [place] Son [phone]     [place], [place] [phone]        Chief Complaint  Patient presents with  . Medical Managment of Chronic Issues    HPI: This is a 79 y.o. male resident of [facility], Memory Care section evaluated today for management of ongoing medical issues.    Last visit: Seizures Single episode of total body jerking with shallow breathing and apparently on focused gaze. Has been assumed to be seizure activity in setting of recent head injury and dementia. Patient is tolerating p.o. Dilantin, will obtain level. Continue medication and close monitoring.  Occipital scalp laceration Wound is healing well though staples not quite ready to be removed completely; patient is frequently picking at the wound. Have removed 6 staples today, will leave the others in place for now.  Since last visit, scalp wound healed without complication. Dilantin level is below therapeutic range, medication dose has not been increased due to no seizure activity.  Nurse reports patient's cognitive and functional status with some decline, patient has been more resistant to taking his medicines.  He has required interventions to manage voiding in appropriate areas. He is noted to be increasingly active with more attempts to leave the unit. He's been more aggressive when staff tries to redirect him. P.r.n. Ativan has been used on occasion, this does cause unsteady gait. Over the last 2 months, there has been significant noise and environmental disruption with construction/redesign of the Memory Care unit.  Annual labs completed October 2014 all satisfactory Review of facility records shows patient's blood pressures very well controlled with systolic blood pressure <120 for the last  month.   Allergies  Allergen Reactions  . Aricept [Donepezil Hcl]   . Galantamine Hydrobromide Nausea Only  . Namenda [Memantine Hcl] Nausea And Vomiting  . Penicillins Nausea Only   Medications Reviewed  DATA REVIEWED  Radiologic Exams   12/30/12 CT scan Head X 2: Intracerebral and subarachnoid hemorrhages involving left frontal and left occipital regions secondary to closed head trauma, stable per repeat CT  2) CT neck: No acute fracture or malalignment identified.   Cardiovascular Exams:   Laboratory Studies  Hosp. Lab Lab Results  Component Value Date   WBC 9.9 12/31/2012   HGB 12.5* 12/31/2012   HCT 36.0* 12/31/2012   PLT 196 12/31/2012        GLUCOSE 142* 12/30/2012   NA 142 12/30/2012   K 3.7 12/30/2012   CL 105 12/30/2012   CREATININE 0.79 12/30/2012   BUN 16 12/30/2012   CO2 25 12/30/2012   Lab Results  Component Value Date   WBC 6.1 02/05/2013   HGB 11.0* 02/05/2013   HCT 32* 02/05/2013   PLT 214 02/05/2013        GLUCOSE 83 02/05/2013    NA 141 02/05/2013   K 4.6 02/05/2013   CL  108   02/06/2039    CREATININE 0.8 02/05/2013   BUN 13 02/05/2013    albumin   3.3   02/05/2013          phenytoin   4.5   02/05/2013      Review of Systems   DATA OBTAINED: from nurse, medical record GENERAL:  No fevers, fatigue, improving appetite   SKIN: No itch, rash  multiple skin tears on hands and arms  EYES: No eye pain, dryness or itching   EARS: No earache, change in hearing DEAF NOSE: No congestion, drainage or bleeding MOUTH/THROAT: No mouth or tooth pain  No sore throat   No difficulty chewing or swallowing RESPIRATORY: No cough, wheezing, SOB CARDIAC: No chest pain,   No edema. GI: No abdominal pain  No N/V/D or constipation   GU: No dysuria, frequency or urgency  No change in urine volume or character   MUSCULOSKELETAL: No joint pain, swelling or stiffness  No back pain  No muscle ache, pain, weakness  Gait is steady    NEUROLOGIC: No fainting,  headache PSYCHIATRIC: No signs of increased anxiety, depression Sleeps well.  frequently difficult in assisting with personal care   Physical Exam Filed Vitals:   03/17/13 1638  BP: 100/67  Temp: 98 F (36.7 C)  Resp: 20  Weight: 146 lb (66.225 kg)    GENERAL APPEARANCE: No acute distress, appropriately groomed, normal body habitus. Alert, pleasant, conversation is impossible due to hearing loss and dementia. Very Limited exam today HEAD: Normocephalic,  EYES: Conjunctiva/lids clear. Marland Kitchen   EARS: DEAF NOSE: No deformity or discharge. MOUTH/THROAT: Lips w/o lesions. Oral mucosa, tongue moist, w/o lesion.   RESPIRATORY: Breathing is even, unlabored.    MUSCULOSKELETAL: Moves all extremities with full ROM, strength and tone. Back is kyphotic, no scoliosis or spinal process tenderness. Gait is steady NEUROLOGIC: Unable to assess orientation, speech unclear, language is impaired,  no tremor.    PSYCHIATRIC: Initially friendly and does not like exam. Attempting to leave unit during exam  ASSESSMENT/PLAN  HTN (hypertension) Systolic blood pressures consistently less than 120, will stop enalapril.  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 This is likely due at least in part to be disturbed environment related to construction. He does not taking scheduled medications all the time, p.r.n. Ativan is sedating however does cause gait disturbance. Continue to monitor closely, provide supportive environment as able  Seizures No seizure activity despite subtherapeutic phenytoin level. No medication change    Follow up: As needed  Aairah Negrette T.Reiko Vinje, NP-C 03/17/2013

## 2013-03-17 NOTE — Assessment & Plan Note (Signed)
No seizure activity despite subtherapeutic phenytoin level. No medication change

## 2013-03-17 NOTE — Assessment & Plan Note (Signed)
Systolic blood pressures consistently less than 120, will stop enalapril.

## 2013-06-23 ENCOUNTER — Encounter: Payer: Self-pay | Admitting: Geriatric Medicine

## 2013-06-23 ENCOUNTER — Non-Acute Institutional Stay (SKILLED_NURSING_FACILITY): Payer: Medicare Other | Admitting: Geriatric Medicine

## 2013-06-23 DIAGNOSIS — R569 Unspecified convulsions: Secondary | ICD-10-CM

## 2013-06-23 DIAGNOSIS — F0280 Dementia in other diseases classified elsewhere without behavioral disturbance: Secondary | ICD-10-CM

## 2013-06-23 NOTE — Progress Notes (Signed)
Patient ID: [name], male   DOB: Nov 27, 1922, 80 y.o.   MRN: [id number]  [facility] [phone])  Code Status: DNR Contact Information   Name Relation Home Work Mobile   [place] Son [phone]     Shubh, Chiara [phone]        Chief Complaint  Patient presents with  . Medical Managment of Chronic Issues    HPI: This is a 80 y.o. male resident of [facility], Memory Care section evaluated today for management of ongoing medical issues.    Last visit: HTN (hypertension) Systolic blood pressures consistently less than 120, will stop enalapril.  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 This is likely due at least in part to be disturbed environment related to construction. He does not taking scheduled medications all the time, p.r.n. Ativan is sedating however does cause gait disturbance. Continue to monitor closely, provide supportive environment as able  Seizures No seizure activity despite subtherapeutic phenytoin level. No medication change  Since last visit patient is then experienced any acute medical issues. He does continue with intermittent intrusive behavior, sometimes becoming aggressive attempts to redirect. He did have one episode of attempted elopement last month. Review facility record shows patient frequently refuses vital signs measurement but most recent measures are satisfactory, weight has been stable. There's been no reported seizure activity. Most recent MDS review January 2015 no significant changes.  Allergies  Allergen Reactions  . Aricept [Donepezil Hcl]   . Galantamine Hydrobromide Nausea Only  . Namenda [Memantine Hcl] Nausea And Vomiting  . Penicillins Nausea Only   MEDICATION - Reviewed  DATA REVIEWED  Radiologic Exams   12/30/12 CT scan Head X 2: Intracerebral and subarachnoid hemorrhages involving left  frontal and left occipital regions secondary to closed head trauma, stable per repeat CT  2) CT neck: No acute fracture or malalignment identified.   Cardiovascular Exams:   Laboratory Studies  Hosp. Lab Lab Results  Component Value Date   WBC 9.9 12/31/2012   HGB 12.5* 12/31/2012   HCT 36.0* 12/31/2012   PLT 196 12/31/2012        GLUCOSE 142* 12/30/2012   NA 142 12/30/2012   K 3.7 12/30/2012   CL 105 12/30/2012   CREATININE 0.79 12/30/2012   BUN 16 12/30/2012   CO2 25 12/30/2012   Lab Results  Component Value Date   WBC 6.1 02/05/2013   HGB 11.0* 02/05/2013   HCT 32* 02/05/2013   PLT 214 02/05/2013        GLUCOSE 83 02/05/2013    NA 141 02/05/2013   K 4.6 02/05/2013   CL  108   02/06/2039    CREATININE 0.8 02/05/2013   BUN 13 02/05/2013    albumin   3.3   02/05/2013          phenytoin   4.5   02/05/2013      REVIEW OF SYSTEMS  DATA OBTAINED: from nurse, medical record GENERAL:  No fevers, fatigue,chnage in activity or appetite   SKIN: No itch, rash  EYES: No eye pain, dryness or itching   EARS: No earache, change in hearing DEAF NOSE: No congestion, drainage or bleeding MOUTH/THROAT: No mouth or tooth pain  No sore throat   No difficulty chewing or swallowing RESPIRATORY: No cough, wheezing, SOB CARDIAC: No chest pain,   No edema. GI: No abdominal pain  No N/V/D or constipation  Frequently incontinent GU: No  dysuria, frequency or urgency  No change in urine volume or character   Frequently incontinent, sometimes voids in inappropriate places MUSCULOSKELETAL: No joint pain, swelling or stiffness  No back pain  No muscle ache, pain, weakness  Gait is steady    NEUROLOGIC: No fainting, headache PSYCHIATRIC: No signs of increased anxiety, depression Sleeps well.  frequently difficult in assisting with personal care   PHYSICAL EXAM  Filed Vitals:   06/23/13 1357  BP: 132/70  Pulse: 79  Temp: 96.4 F (35.8 C)  Weight: 144 lb 8 oz (65.545 kg)  SpO2: 94%    GENERAL  APPEARANCE: No acute distress, appropriately groomed, normal body habitus. Alert, pleasant, conversation is impossible due to hearing loss and dementia. Limited exam today HEAD: Normocephalic,  EYES: Conjunctiva/lids clear. Marland Kitchen.   EARS: DEAF NOSE: No deformity or discharge. MOUTH/THROAT: Lips w/o lesions. Oral mucosa, tongue moist, w/o lesion.   RESPIRATORY: Breathing is even, unlabored.    Lung sounds are clear and full.  CARDIOVASCULAR: Heart RRR. No murmur or extra heart sounds MUSCULOSKELETAL: Moves all extremities with full ROM, strength and tone. Back is kyphotic, no scoliosis or spinal process tenderness. Gait is steady NEUROLOGIC: Unable to assess orientation, speech unclear, language is impaired,  no tremor.    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 Patient requires extensive assistance with most ADLs, often resists this assistance. He does ambulate and eat with supervision only. Minimal interaction with other residents, no participation in unit activities. He does respond to one to one interventions. Update lab.  Seizures No recent seizure activity, update phenytoin level    Follow up: Routine or as needed  Lab/tests:  07/2013 CBC,CMP, phenytoin level   Toribio HarbourClaudette T.Deep Bonawitz, NP-C [facility] [phone]  06/23/2013

## 2013-06-26 NOTE — Assessment & Plan Note (Addendum)
Most recent MDS 04/2013: Patient unable to participate in self assessment, staff reports patient has severely impaired cognitive skills and daily decision making capability. Patient requires extensive assistance with most ADLs, often resists this assistance. He does ambulate and eat with supervision only. Minimal interaction with other residents, no participation in unit activities. He does respond to one to one interventions. Update lab.

## 2013-06-26 NOTE — Assessment & Plan Note (Signed)
No recent seizure activity, update phenytoin level

## 2013-07-16 LAB — BASIC METABOLIC PANEL
BUN: 23 mg/dL — AB (ref 4–21)
CREATININE: 0.8 mg/dL (ref 0.6–1.3)
Glucose: 79 mg/dL
POTASSIUM: 4.9 mmol/L (ref 3.4–5.3)
SODIUM: 144 mmol/L (ref 137–147)

## 2013-07-16 LAB — HEPATIC FUNCTION PANEL
ALT: 9 U/L — AB (ref 10–40)
AST: 17 U/L (ref 14–40)
Alkaline Phosphatase: 95 U/L (ref 25–125)

## 2013-07-16 LAB — CBC AND DIFFERENTIAL
HCT: 36 % — AB (ref 41–53)
Hemoglobin: 12.3 g/dL — AB (ref 13.5–17.5)
Platelets: 228 10*3/uL (ref 150–399)
WBC: 6.5 10*3/mL

## 2013-07-28 ENCOUNTER — Encounter: Payer: Self-pay | Admitting: Geriatric Medicine

## 2013-07-28 ENCOUNTER — Non-Acute Institutional Stay (SKILLED_NURSING_FACILITY): Payer: Medicare Other | Admitting: Geriatric Medicine

## 2013-07-28 DIAGNOSIS — I10 Essential (primary) hypertension: Secondary | ICD-10-CM

## 2013-07-28 DIAGNOSIS — F0280 Dementia in other diseases classified elsewhere without behavioral disturbance: Secondary | ICD-10-CM

## 2013-07-28 NOTE — Progress Notes (Signed)
Patient ID: [name], male   DOB: [birth year], 80 y.o.   MRN: [id number]  [name]    Code Status: DNR  Contact Information   Name Relation Home Work Mobile   [name] Son [phone]     [name],[name] Son [phone]        Chief Complaint  Patient presents with  . Medical Managment of Chronic Issues    HPI: This is a 80 y.o. male resident of [name], Memory Care section evaluated today for management of ongoing medical issues.    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 Patient requires extensive assistance with most ADLs, often resists this assistance. He does ambulate and eat with supervision only. Minimal interaction with other residents, no participation in unit activities. He does respond to one to one interventions. Update lab.  Seizures No recent seizure activity, update phenytoin level  Since last visit patient has not had an acute medical issue. Report from patient's primary CNA today: No change in his daily behaviors or activities. He continues to ambulate around the unit most of the day, eat small amounts, acccepts fluids readily including Boost nutritional supplement. He usually allows bathing, can be combative with other personal care. These episodes are managed with one to one interventions; there's been no recent use of p.r.n. Medication. No recent seizure activity reported, recent Dilantin level very low. Patient continues to accept medication. Recent CBC, CMP satisfactory. Vital signs/ weight stable.  Allergies  Allergen Reactions  . Aricept [Donepezil Hcl]   . Galantamine Hydrobromide Nausea Only  . Namenda [Memantine Hcl] Nausea And Vomiting  . Penicillins Nausea Only   MEDICATION -  Reviewed  DATA REVIEWED  Radiologic Exams   12/30/12 CT scan Head X 2: Intracerebral and subarachnoid hemorrhages involving left frontal and left occipital regions secondary to closed head trauma, stable per repeat CT  2) CT neck: No acute fracture or malalignment identified.   Cardiovascular Exams:   Laboratory Studies   Lab Results  Component Value Date   WBC 6.1 02/05/2013   HGB 11.0* 02/05/2013   HCT 32* 02/05/2013   PLT 214 02/05/2013        GLUCOSE 83 02/05/2013    NA 141 02/05/2013   K 4.6 02/05/2013   CL  108   02/06/2039    CREATININE 0.8 02/05/2013   BUN 13 02/05/2013    albumin   3.3   02/05/2013          phenytoin   4.5   02/05/2013    Lab Results  Component Value Date   WBC 6.5 07/16/2013   HGB 12.3* 07/16/2013   HCT 36* 07/16/2013   MCV 93.0 12/31/2012   PLT 228 07/16/2013   Lab Results  Component Value Date   NA 144 07/16/2013   K 4.9 07/16/2013   GLU 79 07/16/2013   BUN 23* 07/16/2013   CREATININE 0.8 07/16/2013   Albumin  3.9      07/16/2013  phenytoin <2.5      07/16/2013    REVIEW OF SYSTEMS  DATA OBTAINED: from nurse, medical record GENERAL:  No fevers, fatigue,change in activity or appetite   SKIN: No itch, rash  EYES: No eye pain, dryness or itching   EARS: No earache, change in hearing DEAF NOSE: No congestion, drainage or bleeding MOUTH/THROAT:  No mouth or tooth pain  No sore throat   No difficulty chewing or swallowing RESPIRATORY: No cough, wheezing, SOB CARDIAC: No chest pain,   No edema. GI: No abdominal pain  No N/V/D or constipation  Frequently incontinent GU: No dysuria, frequency or urgency  No change in urine volume or character   Frequently incontinent, sometimes voids in inappropriate places MUSCULOSKELETAL: No joint pain, swelling or stiffness  No back pain  No muscle ache, pain, weakness  Gait is steady    NEUROLOGIC: No fainting, headache PSYCHIATRIC: No signs of increased anxiety, depression Sleeps well.  frequently difficult in  assisting with personal care   PHYSICAL EXAM  Filed Vitals:   07/28/13 1109  BP: 126/53  Pulse: 64  Temp: 97 F (36.1 C)  Resp: 16  Weight: 143 lb (64.864 kg)  SpO2: 96%    GENERAL APPEARANCE: No acute distress, appropriately groomed, normal body habitus. Alert, pleasant, conversation is impossible due to hearing loss and dementia. Limited exam today HEAD: Normocephalic, atraumatic EYES: Conjunctiva/lids clear.    EARS: DEAF NOSE: No deformity or discharge. MOUTH/THROAT: Lips w/o lesions. Oral mucosa, tongue moist, w/o lesion.   RESPIRATORY: Breathing is even, unlabored.    Lung sounds are clear and full.  CARDIOVASCULAR: Heart RRR. No murmur or extra heart sounds MUSCULOSKELETAL: Moves all extremities with full ROM, strength and tone. Back is kyphotic, no scoliosis or spinal process tenderness. Gait is steady NEUROLOGIC: Unable to assess orientation, speech unclear, language is impaired,  no tremor.    PSYCHIATRIC: Initially friendly, allows brief exam    ASSESSMENT/PLAN  HTN (hypertension) Blood pressure range satisfactory, 117-126/5367 off medications.  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 Continue supportive measures    Follow up: Routine or as needed  Lab/tests:     Brandon HarbourClaudette T.Mayuri Staples, NP-C [facility] [phone]  07/28/2013

## 2013-07-28 NOTE — Assessment & Plan Note (Signed)
Blood pressure range satisfactory, 117-126/5367 off medications.  Continue routine monitoring recent laboratory studies satisfactory

## 2013-07-28 NOTE — Assessment & Plan Note (Signed)
No change in patient's cognitive or functional status, he remains severe stage. Continue supportive measures

## 2013-10-06 ENCOUNTER — Non-Acute Institutional Stay (SKILLED_NURSING_FACILITY): Payer: Medicare Other | Admitting: Nurse Practitioner

## 2013-10-06 DIAGNOSIS — F02818 Dementia in other diseases classified elsewhere, unspecified severity, with other behavioral disturbance: Secondary | ICD-10-CM

## 2013-10-06 DIAGNOSIS — R569 Unspecified convulsions: Secondary | ICD-10-CM

## 2013-10-06 DIAGNOSIS — K5900 Constipation, unspecified: Secondary | ICD-10-CM

## 2013-10-06 DIAGNOSIS — I10 Essential (primary) hypertension: Secondary | ICD-10-CM

## 2013-10-06 DIAGNOSIS — F0281 Dementia in other diseases classified elsewhere with behavioral disturbance: Secondary | ICD-10-CM

## 2013-10-06 NOTE — Progress Notes (Signed)
Patient ID: [name], male   DOB: 06/16/1922, 80 y.o.   MRN: [id number]    Nursing Home Location:  [facility]   Place of Service: SNF (31)  PCP: Kimber RelicGREEN, ARTHUR G, MD  Allergies  Allergen Reactions  . Aricept [Donepezil Hcl]   . Galantamine Hydrobromide Nausea Only  . Namenda [Memantine Hcl] Nausea And Vomiting  . Penicillins Nausea Only    Chief Complaint  Patient presents with  . Medical Management of Chronic Issues    HPI:  This is a 80 y.o. male resident of [facility], Memory Care section evaluated today for management of ongoing medical issues. Since last visit patient has not had an acute medical issue and there has been no major changes. He continues to ambulate around the unit most of the day, eat small amounts, acccepts fluids readily including Boost nutritional supplement. He usually allows bathing, can be combative with other personal care. These episodes are managed with one to one interventions; there's been no recent use of as needed medications.  Review of Systems:  DATA OBTAINED: from nurse, medical record GENERAL:  No fevers, fatigue,change in activity or appetite    SKIN: No itch, rash   EYES: No eye pain, dryness or itching    EARS: DEAF NOSE: No congestion, drainage or bleeding MOUTH/THROAT: No mouth or tooth pain  No sore throat   No difficulty chewing or swallowing RESPIRATORY: No cough, wheezing, SOB CARDIAC: No chest pain,   No edema. GI: No abdominal pain  No N/V/D or constipation GU: No dysuria, frequency or urgency  No change in urine volume or character   Frequently incontinent MUSCULOSKELETAL: No joint pain, swelling or stiffness  No back pain  No muscle ache, pain, weakness     NEUROLOGIC: No fainting, headache PSYCHIATRIC: No signs of increased anxiety, depression Sleeps well.  frequently difficult in assisting with personal care, has behaviors at times but is re-directed by staff     Past Medical  History  Diagnosis Date  . Loss of weight 12/05/2011  . Pain in joint, shoulder region 02/05/2011  . Neuralgia, neuritis, and radiculitis, unspecified 12/11/2010  . Unspecified venous (peripheral) insufficiency 09/01/2008  . Unspecified hypothyroidism 04/05/2008  . Rickets, active 04/05/2008  . Nocturia 11/25/2006  . Aphasia 05/27/2006  . Unspecified persistent mental disorders due to conditions classified elsewhere 12/10/2005  . 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 Unspecified constipation 12/10/2005  . Other and unspecified hyperlipidemia 07/25/2005  . Tension headache 07/25/2005  . Occlusion and stenosis of carotid artery without mention of cerebral infarction 07/16/2005  . Memory loss 06/25/2005  . Unspecified hearing loss 08/16/2004  . Insomnia, unspecified 07/05/2004  . Allergic rhinitis due to pollen 01/05/2004  . Depressive disorder, not elsewhere classified 12/22/2003  . Unspecified essential hypertension 12/22/2003  . Inguinal hernia without mention of obstruction or gangrene, unilateral or unspecified, (not specified as recurrent) 12/22/2003  . Diaphragmatic hernia without mention of obstruction or gangrene 12/22/2003  . Diverticulosis of colon (without mention of hemorrhage) 12/22/2003`  . Osteoarthrosis, unspecified whether generalized or localized, unspecified site 12/22/2003  . Scoliosis (and kyphoscoliosis), idiopathic 12/22/2003  . Edema 12/22/2003  . Hypertrophy of prostate without urinary obstruction and other lower urinary tract symptoms (LUTS) 12/30/2002  . Osteoporosis, unspecified 08/21/2002  . Loss of weight   . Seizures 01/09/2013  . Primary progressive aphasia 05/27/2006    Seen for evaluation in January 2008 at [facility] in [place]. Primary Progressive  Aphasia was considered unlikely and Alzheimer's was not verified or ruled out. Note was made of the need for a follow-up MRI of the brain, and deafness that  interferes with communication. He subsequently had evaluations that led to recommendations for Speech Therapy to work  . Dementia due to another medical condition 06/25/2005    2013: Cognitive and functional status declined through the year. Transferred to Memory Care section 12/2011 due to difficulty managing in AL setting: wandering, increased confusion, increased need for assistance with ADLs. Patient will benefit from smaller/structured environment of Memory Care unit once he adjusts to the change. Type of dementia is unclear, pt has been tried on donezepil, galantami   Past Surgical History  Procedure Laterality Date  . Tonsillectomy  1930  . Carotid endarterectomy Left 07/2005    with Dacron patch angioplasty  . Colonoscopy  2006    no polyps   Social History:   reports that he has never smoked. He has never used smokeless tobacco. He reports that he does not drink alcohol or use illicit drugs.  Family History  Problem Relation Age of Onset  . Cancer Mother   . Cancer Father     emphysema    Medications: Patient's Medications  New Prescriptions   No medications on file  Previous Medications   CHOLECALCIFEROL (VITAMIN D3) 2000 UNITS TABS    Take 2,000 mg/day by mouth daily.   FINASTERIDE (PROSCAR) 5 MG TABLET    Take 5 mg by mouth daily. Take 1 tablet once daily to help prostate.   LACTOSE FREE NUTRITION (BOOST) LIQD    Take 237 mLs by mouth. Take once daily in the evening   LORAZEPAM (ATIVAN) 0.5 MG TABLET    Take 0.5 mg by mouth every 6 (six) hours as needed for anxiety. Take 1mg  prior to dental procedure   MIRTAZAPINE (REMERON) 30 MG TABLET    Take 15 mg by mouth at bedtime. Take 1 tablet  at bedtime to treat depression,anxiety, increase appetite.   PHENYTOIN (DILANTIN) 50 MG TABLET    Chew 100 mg by mouth 3 (three) times daily.   POLYETHYLENE GLYCOL (MIRALAX / GLYCOLAX) PACKET    Take 17 g by mouth daily. Give 17 gram daily in 8 oz water or juice to prevent constipation.    Modified Medications   No medications on file  Discontinued Medications   No medications on file     Physical Exam:  Filed Vitals:   10/06/13 1359  BP: 118/69  Pulse: 84  Temp: 98.8 F (37.1 C)  Resp: 18  Weight: 142 lb (64.411 kg)    GENERAL APPEARANCE: No acute distress, appropriately groomed, normal body habitus. Alert and Oriented to self only HEAD: Normocephalic, atraumatic EYES: Conjunctiva/lids clear.    EARS: DEAF NOSE: No deformity or discharge. MOUTH/THROAT: Lips w/o lesions. Oral mucosa, tongue moist, w/o lesion.   RESPIRATORY: Breathing is even, unlabored.    Lung sounds are clear and full.  CARDIOVASCULAR: Heart RRR. No murmur or extra heart sounds, trace edema MUSCULOSKELETAL: Moves all extremities with full ROM, strength and tone. Back is kyphotic, no scoliosis or spinal process tenderness. Shuffling Gait NEUROLOGIC: Unable to assess orientation, speech unclear, language is impaired,  no tremor.    PSYCHIATRIC: Initially friendly with some behaviors when trying to examine, allows brief exam       Labs reviewed: Basic Metabolic Panel:  Recent Labs  24/40/10 2150 02/05/13 07/16/13  NA 142 141 144  K 3.7 4.6 4.9  CL  105  --   --   CO2 25  --   --   GLUCOSE 142*  --   --   BUN 16 13 23*  CREATININE 0.79 0.8 0.8  CALCIUM 9.7  --   --    Liver Function Tests:  Recent Labs  07/16/13  AST 17  ALT 9*  ALKPHOS 95   No results found for this basename: LIPASE, AMYLASE,  in the last 8760 hours No results found for this basename: AMMONIA,  in the last 8760 hours CBC:  Recent Labs  12/30/12 2150 12/31/12 0546 02/05/13 07/16/13  WBC 13.0* 9.9 6.1 6.5  HGB 13.1 12.5* 11.0* 12.3*  HCT 38.9* 36.0* 32* 36*  MCV 94.4 93.0  --   --   PLT 220 196 214 228    Assessment/Plan 1. 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 Essential hypertension -off medications, blood pressures within appropriate range  3. Unspecified constipation -has been stable on miralax, will occasionally need facility protocol PRN   4. Seizures Remains on dilantin,no recurrent seizures from initial episode  5. Urinary Frequency -conts on Proscar  6. Depression -has been titration down to remeron 15 mg qhs, without signs of depression, weight loss of 2 lbs since march

## 2013-12-07 ENCOUNTER — Non-Acute Institutional Stay (SKILLED_NURSING_FACILITY): Payer: Medicare Other | Admitting: Nurse Practitioner

## 2013-12-07 DIAGNOSIS — I10 Essential (primary) hypertension: Secondary | ICD-10-CM

## 2013-12-07 DIAGNOSIS — Z9181 History of falling: Secondary | ICD-10-CM

## 2013-12-07 DIAGNOSIS — R634 Abnormal weight loss: Secondary | ICD-10-CM

## 2013-12-07 DIAGNOSIS — F02818 Dementia in other diseases classified elsewhere, unspecified severity, with other behavioral disturbance: Secondary | ICD-10-CM

## 2013-12-07 DIAGNOSIS — F0281 Dementia in other diseases classified elsewhere with behavioral disturbance: Secondary | ICD-10-CM

## 2013-12-07 DIAGNOSIS — K5900 Constipation, unspecified: Secondary | ICD-10-CM

## 2013-12-07 DIAGNOSIS — R569 Unspecified convulsions: Secondary | ICD-10-CM

## 2013-12-07 NOTE — Progress Notes (Signed)
Patient ID: [name], male   DOB: [birth year], 80 y.o.   MRN: [id number]    Nursing Home Location:  [facility]   Place of Service: SNF (31)  PCP: Kimber Relic, MD  Allergies  Allergen Reactions  . Aricept [Donepezil Hcl]   . Galantamine Hydrobromide Nausea Only  . Namenda [Memantine Hcl] Nausea And Vomiting  . Penicillins Nausea Only    Chief Complaint  Patient presents with  . Medical Management of Chronic Issues    HPI:  This is a 80 y.o. male resident of [facility], Memory Care section evaluated today for management of ongoing medical issues. Since last visit patient has not had an acute medical issue and there has been no major changes. He continues to ambulate around the unit most of the day, eat small amounts, acccepts fluids readily including Boost nutritional supplement and sleeps. Pt had fall today but got up easily and conts to walk around unit. Pt with recent dental work that was very difficult due to pt being combative.   Review of Systems:  Unable to obtain ROS from patient.   Past Medical History  Diagnosis Date  . Loss of weight 12/05/2011  . Pain in joint, shoulder region 02/05/2011  . Neuralgia, neuritis, and radiculitis, unspecified 12/11/2010  . Unspecified venous (peripheral) insufficiency 09/01/2008  . Unspecified hypothyroidism 04/05/2008  . Rickets, active 04/05/2008  . Nocturia 11/25/2006  . Aphasia 05/27/2006  . Unspecified persistent mental disorders due to conditions classified elsewhere 12/10/2005  . 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 Unspecified constipation 12/10/2005  . Other and unspecified hyperlipidemia 07/25/2005  . Tension headache 07/25/2005  . Occlusion and stenosis of carotid artery without mention of cerebral infarction 07/16/2005  . Memory loss 06/25/2005  . Unspecified hearing loss 08/16/2004  . Insomnia, unspecified 07/05/2004  . Allergic rhinitis due to pollen 01/05/2004    . Depressive disorder, not elsewhere classified 12/22/2003  . Unspecified essential hypertension 12/22/2003  . Inguinal hernia without mention of obstruction or gangrene, unilateral or unspecified, (not specified as recurrent) 12/22/2003  . Diaphragmatic hernia without mention of obstruction or gangrene 12/22/2003  . Diverticulosis of colon (without mention of hemorrhage) 12/22/2003`  . Osteoarthrosis, unspecified whether generalized or localized, unspecified site 12/22/2003  . Scoliosis (and kyphoscoliosis), idiopathic 12/22/2003  . Edema 12/22/2003  . Hypertrophy of prostate without urinary obstruction and other lower urinary tract symptoms (LUTS) 12/30/2002  . Osteoporosis, unspecified 08/21/2002  . Loss of weight   . Seizures 01/09/2013  . Primary progressive aphasia 05/27/2006    Seen for evaluation in January 2008 at [facility] in [place]. Primary Progressive Aphasia was considered unlikely and Alzheimer's was not verified or ruled out. Note was made of the need for a follow-up MRI of the brain, and deafness that interferes with communication. He subsequently had evaluations that led to recommendations for Speech Therapy to work  . Dementia due to another medical condition 06/25/2005    2013: Cognitive and functional status declined through the year. Transferred to Memory Care section 12/2011 due to difficulty managing in AL setting: wandering, increased confusion, increased need for assistance with ADLs. Patient will benefit from smaller/structured environment of Memory Care unit once he adjusts to the change. Type of dementia is unclear, pt has been tried on donezepil, galantami   Past Surgical History  Procedure Laterality Date  . Tonsillectomy  1930  . Carotid endarterectomy Left 07/2005    with Dacron patch angioplasty  .  Colonoscopy  2006    no polyps   Social History:   reports that he has never smoked. He has never used smokeless  tobacco. He reports that he does not drink alcohol or use illicit drugs.  Family History  Problem Relation Age of Onset  . Cancer Mother   . Cancer Father     emphysema    Medications: Patient's Medications  New Prescriptions   No medications on file  Previous Medications   CHOLECALCIFEROL (VITAMIN D3) 2000 UNITS TABS    Take 2,000 mg/day by mouth daily.   FINASTERIDE (PROSCAR) 5 MG TABLET    Take 5 mg by mouth daily. Take 1 tablet once daily to help prostate.   LACTOSE FREE NUTRITION (BOOST) LIQD    Take 237 mLs by mouth. Take once daily in the evening   LORAZEPAM (ATIVAN) 0.5 MG TABLET    Take 0.5 mg by mouth every 6 (six) hours as needed for anxiety. Take  prior to dental procedure   MIRTAZAPINE (REMERON) 30 MG TABLET    Take 15 mg by mouth at bedtime. Take 1 tablet  at bedtime to treat depression,anxiety, increase appetite.   PHENYTOIN (DILANTIN) 50 MG TABLET    Chew 100 mg by mouth 3 (three) times daily.   POLYETHYLENE GLYCOL (MIRALAX / GLYCOLAX) PACKET    Take 17 g by mouth daily. Give 17 gram daily in 8 oz water or juice to prevent constipation.  Modified Medications   No medications on file  Discontinued Medications   No medications on file     Physical Exam:  Filed Vitals:   12/07/13 1254  BP: 118/66  Pulse: 82  Temp: 96.2 F (35.7 C)  Resp: 20  Weight: 145 lb (65.772 kg)    GENERAL APPEARANCE: No acute distress, appropriately groomed, normal body habitus. Alert and Oriented to self only HEAD: Normocephalic, atraumatic EYES: Conjunctiva/lids clear.    EARS: DEAF NOSE: No deformity or discharge. MOUTH/THROAT: Lips w/o lesions. Oral mucosa, tongue moist, w/o lesion.   RESPIRATORY: Breathing is even, unlabored.    Lung sounds are clear and full.  CARDIOVASCULAR: Heart RRR. No murmur or extra heart sounds, trace edema MUSCULOSKELETAL: Moves all extremities with full ROM, strength and tone. Back is kyphotic, no scoliosis or spinal process tenderness. Shuffling  Gait. Tenderness to left arm distal to elbow after fall. Small abrasion noted but allows for full ROM and good strength  NEUROLOGIC: Unable to assess orientation, speech unclear, language is impaired,  no tremor.    PSYCHIATRIC: Initially friendly with some behaviors when trying to examine, allows brief exam       Labs reviewed: Basic Metabolic Panel:  Recent Labs  16/10/96 2150 02/05/13 07/16/13  NA 142 141 144  K 3.7 4.6 4.9  CL 105  --   --   CO2 25  --   --   GLUCOSE 142*  --   --   BUN 16 13 23*  CREATININE 0.79 0.8 0.8  CALCIUM 9.7  --   --    Liver Function Tests:  Recent Labs  07/16/13  AST 17  ALT 9*  ALKPHOS 95   No results found for this basename: LIPASE, AMYLASE,  in the last 8760 hours No results found for this basename: AMMONIA,  in the last 8760 hours CBC:  Recent Labs  12/30/12 2150 12/31/12 0546 02/05/13 07/16/13  WBC 13.0* 9.9 6.1 6.5  HGB 13.1 12.5* 11.0* 12.3*  HCT 38.9* 36.0* 32* 36*  MCV 94.4  93.0  --   --   PLT 220 196 214 228    Assessment/Plan  1. Unspecified constipation -current controlled   2. Essential hypertension -controlled off medications  3. Seizures -no noted seizure activity, conts dilantin but will refuse medication at times  4. Loss of weight -weight has been stable, remains on remeron  5. 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 S/p fall Does not have frequent falls, staff providing ongoing monitoring but no further intervention needed at this time.

## 2013-12-25 ENCOUNTER — Telehealth: Payer: Self-pay | Admitting: *Deleted

## 2013-12-25 NOTE — Telephone Encounter (Signed)
Patient son, [name], called and stated that he needs a letter stating that his father is mentally capacitated and cannot handle his own financial affairs.  Patient is at [facility]. Please Advise.

## 2013-12-30 ENCOUNTER — Encounter: Payer: Self-pay | Admitting: Internal Medicine

## 2013-12-30 NOTE — Telephone Encounter (Signed)
Patient son, Roen Macgowan, Notified and mailed to him at: [address] [place] [address]

## 2014-01-01 ENCOUNTER — Encounter: Payer: Self-pay | Admitting: Internal Medicine

## 2014-01-01 DIAGNOSIS — Z299 Encounter for prophylactic measures, unspecified: Secondary | ICD-10-CM

## 2014-01-04 ENCOUNTER — Encounter: Payer: Self-pay | Admitting: *Deleted

## 2014-01-05 LAB — BASIC METABOLIC PANEL
BUN: 18 mg/dL (ref 4–21)
Creatinine: 0.8 mg/dL (ref 0.6–1.3)
POTASSIUM: 4.3 mmol/L (ref 3.4–5.3)
Sodium: 142 mmol/L (ref 137–147)

## 2014-01-05 NOTE — Telephone Encounter (Signed)
Patient son called and stated that the letter date is for 2016 not 2015 and needs corrected because the bank wont accept. Retyped and Dr. Chilton Si signed. Faxed to [facility] per son Attn Lita Mains Fax: [phone] and mailed son the original.

## 2014-02-02 ENCOUNTER — Non-Acute Institutional Stay (SKILLED_NURSING_FACILITY): Payer: Medicare Other | Admitting: Nurse Practitioner

## 2014-02-02 DIAGNOSIS — F0281 Dementia in other diseases classified elsewhere with behavioral disturbance: Secondary | ICD-10-CM

## 2014-02-02 DIAGNOSIS — R350 Frequency of micturition: Secondary | ICD-10-CM

## 2014-02-02 DIAGNOSIS — R569 Unspecified convulsions: Secondary | ICD-10-CM

## 2014-02-02 DIAGNOSIS — F02818 Dementia in other diseases classified elsewhere, unspecified severity, with other behavioral disturbance: Secondary | ICD-10-CM

## 2014-02-02 DIAGNOSIS — Z9181 History of falling: Secondary | ICD-10-CM

## 2014-02-02 DIAGNOSIS — Z9189 Other specified personal risk factors, not elsewhere classified: Secondary | ICD-10-CM

## 2014-02-02 NOTE — Progress Notes (Signed)
Patient ID: [name], male   DOB: 10-Oct-1922, 80 y.o.   MRN: [id number]    Nursing Home Location:  [facility]   Place of Service: SNF (31)  PCP: Kimber Relic, MD  Allergies  Allergen Reactions  . Aricept [Donepezil Hcl]   . Galantamine Hydrobromide Nausea Only  . Namenda [Memantine Hcl] Nausea And Vomiting  . Penicillins Nausea Only    Chief Complaint  Patient presents with  . Medical Management of Chronic Issues    HPI:  This is a 80 y.o. male resident of [facility], Memory Care section evaluated today for management of ongoing medical issues. pts now requires 1:1 sitter due to wondering into other residents room. Pt conts to ambulate around facility without difficulty however 3 nights ago had a fall while in bed. Hit head and arm. Does not appear to have any pain from fall and remains at baseline. Pt conts to follow with psychiatry. No recent changes in medication.    Review of Systems:  Unable to obtain ROS from patient.   Past Medical History  Diagnosis Date  . Loss of weight 12/05/2011  . Pain in joint, shoulder region 02/05/2011  . Neuralgia, neuritis, and radiculitis, unspecified 12/11/2010  . Unspecified venous (peripheral) insufficiency 09/01/2008  . Unspecified hypothyroidism 04/05/2008  . Rickets, active 04/05/2008  . Nocturia 11/25/2006  . Aphasia 05/27/2006  . Unspecified persistent mental disorders due to conditions classified elsewhere 12/10/2005  . 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 Unspecified constipation 12/10/2005  . Other and unspecified hyperlipidemia 07/25/2005  . Tension headache 07/25/2005  . Occlusion and stenosis of carotid artery without mention of cerebral infarction 07/16/2005  . Memory loss 06/25/2005  . Unspecified hearing loss 08/16/2004  . Insomnia, unspecified 07/05/2004  . Allergic rhinitis due to pollen 01/05/2004  . Depressive disorder, not elsewhere classified 12/22/2003  .  Unspecified essential hypertension 12/22/2003  . Inguinal hernia without mention of obstruction or gangrene, unilateral or unspecified, (not specified as recurrent) 12/22/2003  . Diaphragmatic hernia without mention of obstruction or gangrene 12/22/2003  . Diverticulosis of colon (without mention of hemorrhage) 12/22/2003`  . Osteoarthrosis, unspecified whether generalized or localized, unspecified site 12/22/2003  . Scoliosis (and kyphoscoliosis), idiopathic 12/22/2003  . Edema 12/22/2003  . Hypertrophy of prostate without urinary obstruction and other lower urinary tract symptoms (LUTS) 12/30/2002  . Osteoporosis, unspecified 08/21/2002  . Loss of weight   . Seizures 01/09/2013  . 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 Primary Progressive Aphasia was considered unlikely and Alzheimer's was not verified or ruled out. Note was made of the need for a follow-up MRI of the brain, and deafness that interferes with communication. He subsequently had evaluations that led to recommendations for Speech Therapy to work  . Dementia due to another medical condition 06/25/2005    2013: Cognitive and functional status declined through the year. Transferred to Memory Care section 12/2011 due to difficulty managing in AL setting: wandering, increased confusion, increased need for assistance with ADLs. Patient will benefit from smaller/structured environment of Memory Care unit once he adjusts to the change. Type of dementia is unclear, pt has been tried on donezepil, galantami   Past Surgical History  Procedure Laterality Date  . Tonsillectomy  1930  . Carotid endarterectomy Left 07/2005    with Dacron patch angioplasty  . Colonoscopy  2006    no polyps  Social History:   reports that he has never smoked. He has never used smokeless tobacco. He reports that he does not drink alcohol or use illicit  drugs.  Family History  Problem Relation Age of Onset  . Cancer Mother   . Cancer Father     emphysema    Medications: Patient's Medications  New Prescriptions   No medications on file  Previous Medications   CARBAMAZEPINE (EQUETRO) 100 MG CP12 12 HR CAPSULE    Take 100 mg by mouth 2 (two) times daily.   CHOLECALCIFEROL (VITAMIN D3) 2000 UNITS TABS    Take 2,000 mg/day by mouth daily.   FINASTERIDE (PROSCAR) 5 MG TABLET    Take 5 mg by mouth daily. Take 1 tablet once daily to help prostate.   LACTOSE FREE NUTRITION (BOOST) LIQD    Take 237 mLs by mouth. Take once daily in the evening   MIRTAZAPINE (REMERON) 30 MG TABLET    Take 30 mg by mouth at bedtime. Take 1 tablet  at bedtime to treat depression,anxiety, increase appetite.   PHENYTOIN (DILANTIN) 50 MG TABLET    Chew 100 mg by mouth 3 (three) times daily.   POLYETHYLENE GLYCOL (MIRALAX / GLYCOLAX) PACKET    Take 17 g by mouth daily. Give 17 gram daily in 8 oz water or juice to prevent constipation.  Modified Medications   No medications on file  Discontinued Medications   No medications on file     Physical Exam:  Filed Vitals:   02/02/14 1430  BP: 112/56  Pulse: 60  Temp: 98.1 F (36.7 C)  Resp: 16  Weight: 145 lb (65.772 kg)    GENERAL APPEARANCE: No acute distress, appropriately groomed, normal body habitus. Alert and Oriented to self only HEAD: hematoma over left eyebrow  EYES: Conjunctiva/lids clear.    EARS: DEAF NOSE: No deformity or discharge. MOUTH/THROAT: Lips w/o lesions. Oral mucosa, tongue moist, w/o lesion.   RESPIRATORY: Breathing is even, unlabored.    Lung sounds are clear and full.  CARDIOVASCULAR: Heart RRR. No murmur or extra heart sounds, trace edema MUSCULOSKELETAL: Moves all extremities with full ROM, strength and tone. Back is kyphotic, no scoliosis or spinal process tenderness. Shuffling Gait. Tenderness to left arm distal to elbow after fall. Small abrasion noted but allows for full ROM and  good strength  NEUROLOGIC: Unable to assess orientation, speech unclear, language is impaired,  no tremor.    PSYCHIATRIC: friendly with behaviors when trying to examine, allows brief exam       Labs reviewed: Basic Metabolic Panel:  Recent Labs  40/98/1110/23/14 07/16/13 01/05/14  NA 141 144 142  K 4.6 4.9 4.3  BUN 13 23* 18  CREATININE 0.8 0.8 0.8   Liver Function Tests:  Recent Labs  07/16/13  AST 17  ALT 9*  ALKPHOS 95   No results found for this basename: LIPASE, AMYLASE,  in the last 8760 hours No results found for this basename: AMMONIA,  in the last 8760 hours CBC:  Recent Labs  02/05/13 07/16/13  WBC 6.1 6.5  HGB 11.0* 12.3*  HCT 32* 36*  PLT 214 228    Assessment/Plan  1. 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 Seizures -no noted seizures, conts on dilantin   3. Urinary frequency incontinent of urine, remains on proscar  4. Status post fall -pt with hematoma and ecchymosis to arm, does not  appear pain and is at baseline.

## 2014-02-23 LAB — BASIC METABOLIC PANEL
BUN: 16 mg/dL (ref 4–21)
Creatinine: 0.7 mg/dL (ref 0.6–1.3)
Glucose: 76 mg/dL
Potassium: 4.7 mmol/L (ref 3.4–5.3)
Sodium: 141 mmol/L (ref 137–147)

## 2014-02-23 LAB — CBC AND DIFFERENTIAL
HEMATOCRIT: 36 % — AB (ref 41–53)
Hemoglobin: 12.9 g/dL — AB (ref 13.5–17.5)
PLATELETS: 234 10*3/uL (ref 150–399)
WBC: 5.8 10^3/mL

## 2014-02-25 ENCOUNTER — Non-Acute Institutional Stay (SKILLED_NURSING_FACILITY): Payer: Medicare Other | Admitting: Adult Health

## 2014-02-25 DIAGNOSIS — R569 Unspecified convulsions: Secondary | ICD-10-CM

## 2014-02-25 DIAGNOSIS — R197 Diarrhea, unspecified: Secondary | ICD-10-CM | POA: Insufficient documentation

## 2014-02-25 NOTE — Assessment & Plan Note (Signed)
There is no recent seizure activity. We will need to monitor him closely and follow up with Dr Donell BeersPlovsky.

## 2014-02-25 NOTE — Assessment & Plan Note (Signed)
I am concerned that this could be due to the Belsomra, since the symptoms occurred after the med was started. The staff spoke with Dr. Donell BeersPlovsky and [facility]. I have ordered Florastor 1 cap BID to his regimen. We will monitor him for improvement over the weekend and if there is no improvement consider further testing or discontinuing the Belsomra. He is combative with advanced dementia and aggressive workup is not indicated. He does not appear toxic and his VS are stable.

## 2014-02-25 NOTE — Progress Notes (Addendum)
Patient ID: [name], male   DOB: 07-14-1922, 80 y.o.   MRN: [id number]   Nursing Home Location:  [facility]   Place of Service: SNF [phone](31)  Chief Complaint  Patient presents with  . Diarrhea    HPI:  This is an 80 [facility]. [name]. The staff asked me to see him for complaints of loose stools This started about a week ago. He has had four loose stools this morning that are dark and watery. There is no obvious blood. His appetite has decreased over time and he is mostly eating cereal and drinking fluids. He as not been in pain or having any nausea or vomiting. He has also been afebrile. His weight has decreased by 5 lbs in the past two months. He was started on Belsomra on 11/4 for insomnia and the dose was increased on 11/10 by Dr. Donell [name].   A cdiff and BMP and CBC have been ordered because of this issue and they are negative.   Review of Systems:  Review of Systems  Unable to perform ROS due to dementia  Medications: Patient's Medications  New Prescriptions   No medications on file  Previous Medications   CARBAMAZEPINE (EQUETRO) 100 MG CP12 12 HR CAPSULE    Take 100 mg by mouth 2 (two) times daily.   LACTOSE FREE NUTRITION (BOOST) LIQD    Take 237 mLs by mouth. Take once daily in the evening   POLYETHYLENE GLYCOL (MIRALAX / GLYCOLAX) PACKET    Take 17 g by mouth daily as needed. Give 17 gram daily in 8 oz water or juice to prevent constipation.   SACCHAROMYCES BOULARDII (FLORASTOR) 250 MG CAPSULE    Take 250 mg by mouth 2 (two) times daily.   SUVOREXANT (BELSOMRA) 10 MG TABS    Take 10 mg by mouth at bedtime.  Modified Medications   No medications on file  Discontinued Medications   CHOLECALCIFEROL (VITAMIN D3) 2000 UNITS TABS    Take 2,000 mg/day by mouth daily.   FINASTERIDE (PROSCAR) 5 MG TABLET    Take 5 mg by mouth daily. Take 1 tablet once daily to help prostate.   MIRTAZAPINE (REMERON) 30 MG TABLET    Take 30 mg  by mouth at bedtime. Take 1 tablet  at bedtime to treat depression,anxiety, increase appetite.   PHENYTOIN (DILANTIN) 50 MG TABLET    Chew 100 mg by mouth 3 (three) times daily.     Physical Exam:  Filed Vitals:   02/25/14 1229  BP: 104/62  Pulse: 62  Temp: 98.5 F (36.9 C)  Resp: 20  Weight: 140 lb (63.504 kg)  SpO2: 95%   GENERAL APPEARANCE: No acute distress, appropriately groomed, frail SKIN: No diaphoresis, rash, unusual lesions, wounds HEAD: Normocephalic, atraumatic EYES: Conjunctiva/lids clear. Pupils round, reactive. NECK: Supple, full ROM. No thyroid tenderness, enlargement or nodule LYMPHATICS: No head, neck or supraclavicular adenopathy RESPIRATORY: Breathing is even, unlabored. Lung sounds are clear and full.  CARDIOVASCULAR: Heart RRR. No murmur or extra heart sounds  ARTERIAL: BPPP  EDEMA: No peripheral edema. No ascites GASTROINTESTINAL: Abdomen is soft, non-tender, not distended w/ normal bowel sounds. No hepatic or splenic enlargement. No mass, ventral or inguinal hernia. MUSCULOSKELETAL: Moves all extremities with full ROM, strength and tone.  NEUROLOGIC: Alert but not oriented. Will not f/c. No obvious focal deficit PSYCHIATRIC: resistant to care and exam   Labs reviewed/Significant Diagnostic Results:  Basic Metabolic Panel:  Recent Labs  98/02/9103/02/15 01/05/14  02/23/14  NA 144 142 141  K 4.9 4.3 4.7  BUN 23* 18 16  CREATININE 0.8 0.8 0.7   Liver Function Tests:  Recent Labs  07/16/13  AST 17  ALT 9*  ALKPHOS 95   No results for input(s): LIPASE, AMYLASE in the last 8760 hours. No results for input(s): AMMONIA in the last 8760 hours. CBC:  Recent Labs  07/16/13 02/23/14  WBC 6.5 5.8  HGB 12.3* 12.9*  HCT 36* 36*  PLT 228 234    Assessment/Plan Diarrhea I am concerned that this could be due to the Belsomra, since the symptoms occurred after the med was started. The staff spoke with Dr. Donell [name] and he discontinued the [facility] instead. I  have ordered Florastor 1 cap BID to his regimen. We will monitor him for improvement over the weekend and if there is no improvement consider further testing or discontinuing the Belsomra. He is combative with advanced dementia and aggressive workup is not indicated. He does not appear toxic and his VS are stable.   Seizures There is no recent seizure activity. We will need to monitor him closely and follow up with Dr [name].     Peggye LeyChristy Payden [name], ANP [phone]

## 2014-03-18 ENCOUNTER — Encounter: Payer: Self-pay | Admitting: Adult Health

## 2014-03-18 ENCOUNTER — Non-Acute Institutional Stay (SKILLED_NURSING_FACILITY): Payer: Medicare Other | Admitting: Adult Health

## 2014-03-18 DIAGNOSIS — R569 Unspecified convulsions: Secondary | ICD-10-CM

## 2014-03-18 DIAGNOSIS — I10 Essential (primary) hypertension: Secondary | ICD-10-CM

## 2014-03-18 DIAGNOSIS — R197 Diarrhea, unspecified: Secondary | ICD-10-CM

## 2014-03-18 DIAGNOSIS — F02818 Dementia in other diseases classified elsewhere, unspecified severity, with other behavioral disturbance: Secondary | ICD-10-CM

## 2014-03-18 DIAGNOSIS — G4700 Insomnia, unspecified: Secondary | ICD-10-CM

## 2014-03-18 DIAGNOSIS — F0281 Dementia in other diseases classified elsewhere with behavioral disturbance: Secondary | ICD-10-CM

## 2014-03-25 ENCOUNTER — Encounter: Payer: Self-pay | Admitting: Adult Health

## 2014-03-25 DIAGNOSIS — G4700 Insomnia, unspecified: Secondary | ICD-10-CM | POA: Insufficient documentation

## 2014-03-25 NOTE — Assessment & Plan Note (Signed)
BP at goal, not currently on meds. Continue to monitor.

## 2014-03-25 NOTE — Assessment & Plan Note (Signed)
Improved when tegretol was discontinued but also he was started on Florastor. Will continue for another month and consider d/c at next visit.

## 2014-03-25 NOTE — Progress Notes (Signed)
Patient ID: [name], male   DOB: 12/09/1922, 80 y.o.   MRN: [id number]  Nursing Home Location:  [facility]   Code Status: DNR   Place of Service: SNF (31)  Chief Complaint  Patient presents with  . Medical Management of Chronic Issues    HPI:  80 y.o.  [facility]. I am here to review his chronic medical issues.  VS have been stable over the past month. Weight has decreased by 5lbs in the past month to 140.7 lbs. The staff reports that he is eating less over time. He has advanced dementia with an altered presentation and was never diagnosed by neuro as AD. He currently is non verbal and wanders through out the unit with assistance. He is combative and occasionally hits and kicks but is redirected by staff fairly easily  Review of Systems:  Review of Systems  Unable to perform ROS: Dementia    Medications: Patient's Medications  New Prescriptions   No medications on file  Previous Medications   LACTOSE FREE NUTRITION (BOOST) LIQD    Take 237 mLs by mouth. Take once daily in the evening   MIRTAZAPINE (REMERON) 30 MG TABLET    Take 30 mg by mouth at bedtime.   OXCARBAZEPINE (TRILEPTAL) 150 MG TABLET    Take 150 mg by mouth 2 (two) times daily.   POLYETHYLENE GLYCOL (MIRALAX / GLYCOLAX) PACKET    Take 17 g by mouth daily as needed. Give 17 gram daily in 8 oz water or juice to prevent constipation.   SACCHAROMYCES BOULARDII (FLORASTOR) 250 MG CAPSULE    Take 250 mg by mouth 2 (two) times daily.   SUVOREXANT (BELSOMRA) 10 MG TABS    Take 10 mg by mouth at bedtime.   TEMAZEPAM (RESTORIL) 30 MG CAPSULE    Take 30 mg by mouth at bedtime as needed for sleep.  Modified Medications   No medications on file  Discontinued Medications   CARBAMAZEPINE (EQUETRO) 100 MG CP12 12 HR CAPSULE    Take 100 mg by mouth 2 (two) times daily.     Physical Exam:  Filed Vitals:   03/18/14 1152  BP: 109/76  Pulse:  95  Temp: 97.3 F (36.3 C)  Resp: 20  Weight: 140 lb 11.2 oz (63.821 kg)    Physical Exam  Constitutional: No distress.  frail  HENT:  Head: Normocephalic and atraumatic.  Neck:  Resisted exam of his neck  Cardiovascular: Normal rate, regular rhythm and normal heart sounds.   No murmur heard. Pulmonary/Chest: Effort normal and breath sounds normal. No respiratory distress. He has no wheezes.  Abdominal: Soft. Bowel sounds are normal. He exhibits no distension. There is no tenderness.  Musculoskeletal:  Resisted musculoskeletal exam  Neurological: He is alert.  No obvious focal deficit. Not able to follow commands, not verbal. Slow shuffling gait. No tremor.  Skin: Skin is warm and dry. He is not diaphoretic.  Psychiatric:  Resistant to exam, somewhat agitated    Labs reviewed/Significant Diagnostic Results:  Basic Metabolic Panel:  Recent Labs  40/98/1102/05/31 01/05/14 02/23/14  NA 144 142 141  K 4.9 4.3 4.7  BUN 23* 18 16  CREATININE 0.8 0.8 0.7   Liver Function Tests:  Recent Labs  07/16/13  AST 17  ALT 9*  ALKPHOS 95   No results for input(s): LIPASE, AMYLASE in the last 8760 hours. No results for input(s): AMMONIA in the last 8760 hours.  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 He is aphasic and sometimes combative with staff. Continue supportive measures.  Seizures He was taken off tegretol due to a possible s/e of diarrhea by Dr. Donell BeersPlovsky. He experienced some shaking episodes that may have been seizure related and he was placed on Trileptal. He seems to be tolerating this well.   HTN (hypertension) BP at goal, not currently on meds. Continue to monitor.  Insomnia He wanders at night and has difficulty sleeping. Dr. Donell BeersPlovsky has prescribed Belsomra. Continue to monitor response and  fall prec in place.  Diarrhea Improved when tegretol was discontinued but also he was started on Florastor. Will continue for another month and consider d/c at next visit.  Monitor weight loss   Peggye Leyhristy Ericberto Padget, ANP [facility] [phone]

## 2014-03-25 NOTE — Assessment & Plan Note (Signed)
He wanders at night and has difficulty sleeping. Dr. Donell BeersPlovsky has prescribed Belsomra. Continue to monitor response and fall prec in place.

## 2014-03-25 NOTE — Assessment & Plan Note (Signed)
No change in his condition. He is aphasic and sometimes combative with staff. Continue supportive measures.

## 2014-03-25 NOTE — Assessment & Plan Note (Addendum)
He was taken off tegretol due to a possible s/e of diarrhea by Dr. Donell BeersPlovsky. He experienced some shaking episodes that may have been seizure related and he was placed on Trileptal. He seems to be tolerating this well.

## 2014-04-01 ENCOUNTER — Non-Acute Institutional Stay (SKILLED_NURSING_FACILITY): Payer: Medicare Other | Admitting: Adult Health

## 2014-04-01 DIAGNOSIS — K5901 Slow transit constipation: Secondary | ICD-10-CM

## 2014-04-01 DIAGNOSIS — K4090 Unilateral inguinal hernia, without obstruction or gangrene, not specified as recurrent: Secondary | ICD-10-CM

## 2014-04-02 ENCOUNTER — Encounter: Payer: Self-pay | Admitting: Adult Health

## 2014-04-02 DIAGNOSIS — K4090 Unilateral inguinal hernia, without obstruction or gangrene, not specified as recurrent: Secondary | ICD-10-CM | POA: Insufficient documentation

## 2014-04-02 NOTE — Assessment & Plan Note (Signed)
There is a non-reducible inguinal hernia noted to the right canal and into the testicles. He does not appear toxic, not in pain, voiding regular, having B.M.s, no fevers, regular appetite.  I have asked the staff to monitor this area qday and let PSC know if any of the above symptoms occur. I attempted to call his son but there was no answer, the staff will call later today. He is not an elective surgical candidate due to the advanced nature of his dementia and resistance to care/intervention. He is a DNR. If the hernia becomes strangulated, he would need surgery of course, but given his debility and advanced dementia, further discussion with his family would be needed.

## 2014-04-02 NOTE — Assessment & Plan Note (Signed)
He is currently having regular BMs per staff, not currently on meds. Previous reports of diarrhea last month have resolved

## 2014-04-02 NOTE — Progress Notes (Signed)
Patient ID: Rocky Linkdwin F Blubaugh, male   DOB: December 18, 1922, 80 y.o.   MRN: [id number]    Patient ID: Rocky Linkdwin F Currington, male   DOB: December 18, 1922, [age] y.o.   MRN: [id number]  Nursing Home Location:  [facility]   Code Status: DNR   Place of Service: SNF (31)  Chief Complaint  Patient presents with  . Acute Visit    hernia    HPI:  80 y.o.  [facility]. I was asked to see him today due to a hernia noted to his right inguinal canal and right testicle. The resident has not had a fever or appeared to be in pain. He has a poor appetite at baseline but his intake has not changed. He is having regular BMs and voiding regularly. He has advanced dementia and can not contribute to the hx. He wanders throughout the unit and is combative regularly with the staff.   Review of Systems:  Review of Systems  Unable to perform ROS: Dementia    Medications: Patient's Medications  New Prescriptions   No medications on file  Previous Medications   LACTOSE FREE NUTRITION (BOOST) LIQD    Take 237 mLs by mouth. Take once daily in the evening   MIRTAZAPINE (REMERON) 30 MG TABLET    Take 30 mg by mouth at bedtime.   OXCARBAZEPINE (TRILEPTAL) 150 MG TABLET    Take 150 mg by mouth 2 (two) times daily.   POLYETHYLENE GLYCOL (MIRALAX / GLYCOLAX) PACKET    Take 17 g by mouth daily as needed. Give 17 gram daily in 8 oz water or juice to prevent constipation.   SACCHAROMYCES BOULARDII (FLORASTOR) 250 MG CAPSULE    Take 250 mg by mouth 2 (two) times daily.   SUVOREXANT (BELSOMRA) 10 MG TABS    Take 10 mg by mouth at bedtime.   TEMAZEPAM (RESTORIL) 30 MG CAPSULE    Take 30 mg by mouth at bedtime as needed for sleep.  Modified Medications   No medications on file  Discontinued Medications   No medications on file     Physical Exam: He refused VS, they will try later when he is calmer.  There were no vitals filed for this visit.  Physical Exam    Constitutional: No distress.  frail  HENT:  Head: Normocephalic and atraumatic.  Neck:  Resisted exam of his neck  Cardiovascular: Normal rate, regular rhythm and normal heart sounds.   No murmur heard. Pulmonary/Chest: Effort normal and breath sounds normal. No respiratory distress. He has no wheezes.  Abdominal: Soft. Bowel sounds are normal. He exhibits no distension. There is no tenderness. There is no rebound and no guarding. A hernia is present. Hernia confirmed positive in the right inguinal area.    Genitourinary: He exhibits no testicular tenderness and no scrotal tenderness. Penis exhibits no lesions. No discharge found.  Neurological: He is alert.  No obvious focal deficit. Not able to follow commands, not verbal. Slow shuffling gait. No tremor.  Skin: Skin is warm and dry. He is not diaphoretic.  Psychiatric:  Resistant to exam, somewhat agitated    Labs reviewed/Significant Diagnostic Results:  Basic Metabolic Panel:  Recent Labs  08/65/7802/05/31 01/05/14 02/23/14  NA 144 142 141  K 4.9 4.3 4.7  BUN 23* 18 16  CREATININE 0.8 0.8 0.7   Liver Function Tests:  Recent Labs  07/16/13  AST 17  ALT 9*  ALKPHOS 95   No results for  input(s): LIPASE, AMYLASE in the last 8760 hours. No results for input(s): AMMONIA in the last 8760 hours. CBC:  Recent Labs  07/16/13 02/23/14  WBC 6.5 5.8  HGB 12.3* 12.9*  HCT 36* 36*  PLT 228 234    Assessment/Plan  Inguinal hernia There is a non-reducible inguinal hernia noted to the right canal and into the testicles. He does not appear toxic, not in pain, voiding regular, having B.M.s, no fevers, regular appetite.  I have asked the staff to monitor this area qday and let PSC know if any of the above symptoms occur. I attempted to call his son but there was no answer, the staff will call later today. He is not an elective surgical candidate due to the advanced nature of his dementia and resistance to care/intervention. He is a  DNR. If the hernia becomes strangulated, he would need surgery of course, but given his debility and advanced dementia, further discussion with his family would be needed.   Constipation He is currently having regular BMs per staff, not currently on meds. Previous reports of diarrhea last month have resolved   I spent 40 min working with the staff and resident and in review of the chart for this visit.  Peggye Leyhristy Zelda Reames, ANP [facility] [phone]

## 2014-04-12 ENCOUNTER — Non-Acute Institutional Stay (SKILLED_NURSING_FACILITY): Payer: Medicare Other | Admitting: Adult Health

## 2014-04-12 DIAGNOSIS — K4090 Unilateral inguinal hernia, without obstruction or gangrene, not specified as recurrent: Secondary | ICD-10-CM

## 2014-04-12 NOTE — Progress Notes (Signed)
Patient ID: [name], male   DOB: 1922/09/27, 80 y.o.   MRN: [id number]  Nursing Home Location:  [facility]   Code Status: DNR   Place of Service: SNF (31)  Chief Complaint  Patient presents with  . Acute Visit    f/u hernia, discussion with son    HPI:  80 y.o.  [facility], [facility]. I was asked to see him today due to a hernia noted to his right inguinal canal and right testicle and to speak with his son regarding this issue. The resident has not had a fever or appeared to be in pain. He has a poor appetite at baseline but his intake has not changed. He is having regular BMs and voiding regularly. He has advanced dementia and can not contribute to the hx. He wanders throughout the unit and is combative regularly with the staff.   Review of Systems:  Review of Systems  Unable to perform ROS: Dementia    Medications: Patient's Medications  New Prescriptions   No medications on file  Previous Medications   LACTOSE FREE NUTRITION (BOOST) LIQD    Take 237 mLs by mouth. Take once daily in the evening   MIRTAZAPINE (REMERON) 30 MG TABLET    Take 30 mg by mouth at bedtime.   OXCARBAZEPINE (TRILEPTAL) 150 MG TABLET    Take 150 mg by mouth 2 (two) times daily.   POLYETHYLENE GLYCOL (MIRALAX / GLYCOLAX) PACKET    Take 17 g by mouth daily as needed. Give 17 gram daily in 8 oz water or juice to prevent constipation.   SACCHAROMYCES BOULARDII (FLORASTOR) 250 MG CAPSULE    Take 250 mg by mouth 2 (two) times daily.   SUVOREXANT (BELSOMRA) 10 MG TABS    Take 10 mg by mouth at bedtime.   TEMAZEPAM (RESTORIL) 30 MG CAPSULE    Take 30 mg by mouth at bedtime as needed for sleep.  Modified Medications   No medications on file  Discontinued Medications   No medications on file     Physical Exam: He refused VS, they will try later when he is calmer.  Filed Vitals:   04/12/14 1544  BP: 106/52  Pulse: 60  Temp: 96.8  F (36 C)  Resp: 20  SpO2: 91%    Physical Exam  Constitutional: No distress.  frail  HENT:  Head: Normocephalic and atraumatic.  Neck:  Resisted exam of his neck  Cardiovascular: Normal rate, regular rhythm and normal heart sounds.   No murmur heard. Pulmonary/Chest: Effort normal and breath sounds normal. No respiratory distress. He has no wheezes.  Abdominal: Soft. Bowel sounds are normal. He exhibits no distension. There is no tenderness. There is no rebound and no guarding. A hernia is present. Hernia confirmed positive in the right inguinal area.    Genitourinary: He exhibits no testicular tenderness and no scrotal tenderness. Penis exhibits no lesions. No discharge found.  Neurological: He is alert.  No obvious focal deficit. Not able to follow commands, not verbal. Slow shuffling gait. No tremor.  Skin: Skin is warm and dry. He is not diaphoretic.  Psychiatric:  Resistant to exam, somewhat agitated    Labs reviewed/Significant Diagnostic Results:  Basic Metabolic Panel:  Recent Labs  40/98/1102/05/31 01/05/14 02/23/14  NA 144 142 141  K 4.9 4.3 4.7  BUN 23* 18 16  CREATININE 0.8 0.8 0.7   Liver Function Tests:  Recent Labs  07/16/13  AST 17  ALT 9*  ALKPHOS 95   No results for input(s): LIPASE, AMYLASE in the last 8760 hours. No results for input(s): AMMONIA in the last 8760 hours. CBC:  Recent Labs  07/16/13 02/23/14  WBC 6.5 5.8  HGB 12.3* 12.9*  HCT 36* 36*  PLT 228 234    Assessment/Plan  Inguinal hernia No change in resident condition. Discussed with resident's son at length regarding the hernia. He is not an elective surgical candidate given his advanced dementia. He is resistant to care and does not follow commands. He is non verbal and but continues to ambulate. His son agreed that he would not want to put him through a hernia repair. We will continue to monitor for strangulation. If this occurs we would need to make a decision if he is appropriate  for emergency surgery but I do not feel that he will be. I spent 30 min in review of his record and with his son. I spent >50% in counseling and coordination of care.    Peggye Leyhristy Bryelle Spiewak, ANP [facility] [phone]

## 2014-04-12 NOTE — Assessment & Plan Note (Signed)
No change in resident condition. Discussed with resident's son at length regarding the hernia. He is not an elective surgical candidate given his advanced dementia. He is resistant to care and does not follow commands. He is non verbal and but continues to ambulate. His son agreed that he would not want to put him through a hernia repair. We will continue to monitor for strangulation. If this occurs we would need to make a decision if he is appropriate for emergency surgery but I do not feel that he will be. I spent 30 min in review of his record and with his son. I spent >50% in counseling and coordination of care.

## 2014-04-19 ENCOUNTER — Encounter: Payer: Self-pay | Admitting: Adult Health

## 2014-04-19 ENCOUNTER — Non-Acute Institutional Stay (SKILLED_NURSING_FACILITY): Payer: Medicare Other | Admitting: Adult Health

## 2014-04-19 DIAGNOSIS — K4090 Unilateral inguinal hernia, without obstruction or gangrene, not specified as recurrent: Secondary | ICD-10-CM

## 2014-04-19 DIAGNOSIS — R634 Abnormal weight loss: Secondary | ICD-10-CM | POA: Diagnosis not present

## 2014-04-19 NOTE — Assessment & Plan Note (Addendum)
He has had a 5lb weight loss in the past month. Several other residents have had the same issue and there is concern for accuracy of the weights. He has had a poor appetite for quite some time and the staff reports that it has not deviated from baseline. There have been no episodes of diarrhea or vomiting. If the weight is accurate it may reflect a worsening in his debility and cognition. He also has an inguinal hernia , see below. Continue to monitor, check weight weekly x4 weeks

## 2014-04-19 NOTE — Assessment & Plan Note (Signed)
It did not appear worse to me. His abd did not appear acute and he did not appear toxic. He is not a surgical candidate for elective surgery as discussed at our last visit. We will continue to monitor this issue. In the event that the hernia becomes strangulated we could consider emergent intervention, but given his combativeness and dementia I think it would be best if we focus on comfort care measures.

## 2014-04-19 NOTE — Progress Notes (Signed)
Patient ID: [name], male   DOB: 08/28/1922, 81 y.o.   MRN: [id number]  Nursing Home Location:  [facility]   Code Status: DNR   Place of Service: SNF (31)  Chief Complaint  Patient presents with  . Acute Visit    worsening hernia    HPI:  81 y.o.  Male residing at [facility], memory care section. I was asked to see him today due to a hernia noted to his right inguinal canal that appeared worse. This issue has been discussed with his son and we have elected not to send him for a surgical consult due to his advanced dementia and combative behavior. He has not had a fever or pain per the staff. His appetite is poor at baseline and this has not changed. He has lost 5 lbs in the past month (?weight accuracy).  There have been no reports of abd pain, nausea, or vomiting.  Review of Systems:  Review of Systems  Unable to perform ROS: Dementia    Medications: Patient's Medications  New Prescriptions   No medications on file  Previous Medications   LACTOSE FREE NUTRITION (BOOST) LIQD    Take 237 mLs by mouth. Take once daily in the evening   MIRTAZAPINE (REMERON) 30 MG TABLET    Take 30 mg by mouth at bedtime.   OXCARBAZEPINE (TRILEPTAL) 150 MG TABLET    Take 150 mg by mouth 2 (two) times daily.   POLYETHYLENE GLYCOL (MIRALAX / GLYCOLAX) PACKET    Take 17 g by mouth daily as needed. Give 17 gram daily in 8 oz water or juice to prevent constipation.   SACCHAROMYCES BOULARDII (FLORASTOR) 250 MG CAPSULE    Take 250 mg by mouth 2 (two) times daily.   SUVOREXANT (BELSOMRA) 10 MG TABS    Take 10 mg by mouth at bedtime.   TEMAZEPAM (RESTORIL) 30 MG CAPSULE    Take 30 mg by mouth at bedtime as needed for sleep.  Modified Medications   No medications on file  Discontinued Medications   No medications on file     Physical Exam:   Filed Vitals:   04/19/14 1031  BP: 93/43  Pulse: 73  Temp: 97 F (36.1 C)  Resp: 20  Weight: 138 lb  (62.596 kg)    Physical Exam  Constitutional: No distress.  frail  HENT:  Head: Normocephalic and atraumatic.  Neck:  Resisted exam of his neck  Cardiovascular: Normal rate, regular rhythm and normal heart sounds.   No murmur heard. Pulmonary/Chest: Effort normal and breath sounds normal. No respiratory distress. He has no wheezes.  Abdominal: Soft. Bowel sounds are normal. He exhibits no distension. There is no tenderness. There is no rebound and no guarding. A hernia is present. Hernia confirmed positive in the right inguinal area.    Genitourinary: He exhibits no testicular tenderness and no scrotal tenderness. Penis exhibits no lesions. No discharge found.  Neurological: He is alert.  No obvious focal deficit. Not able to follow commands, not verbal. Slow shuffling gait. No tremor.  Skin: Skin is warm and dry. He is not diaphoretic.  Psychiatric:  Resistant to exam, somewhat agitated    Labs reviewed/Significant Diagnostic Results:  Basic Metabolic Panel:  Recent Labs  44/01/02 01/05/14 02/23/14  NA 144 142 141  K 4.9 4.3 4.7  BUN 23* 18 16  CREATININE 0.8 0.8 0.7   Liver Function Tests:  Recent Labs  07/16/13  AST 17  ALT  9*  ALKPHOS 95   No results for input(s): LIPASE, AMYLASE in the last 8760 hours. No results for input(s): AMMONIA in the last 8760 hours. CBC:  Recent Labs  07/16/13 02/23/14  WBC 6.5 5.8  HGB 12.3* 12.9*  HCT 36* 36*  PLT 228 234    Assessment/Plan  Loss of weight He has had a 5lb weight loss in the past month. Several other residents have had the same issue and there is concern for accuracy of the weights. He has had a poor appetite for quite some time and the staff reports that it has not deviated from baseline. There have been no episodes of diarrhea or vomiting. If the weight is accurate it may reflect a worsening in his debility and cognition. He also has an inguinal hernia , see below. Continue to monitor, check weight weekly x4  weeks  Inguinal hernia It did not appear worse to me. His abd did not appear acute and he did not appear toxic. He is not a surgical candidate for elective surgery as discussed at our last visit. We will continue to monitor this issue. In the event that the hernia becomes strangulated we could consider emergent intervention, but given his combativeness and dementia I think it would be best if we focus on comfort care measures.    Peggye Ley, ANP [facility] [phone]

## 2014-04-20 DIAGNOSIS — R634 Abnormal weight loss: Secondary | ICD-10-CM | POA: Diagnosis not present

## 2014-04-20 DIAGNOSIS — L84 Corns and callosities: Secondary | ICD-10-CM | POA: Diagnosis not present

## 2014-04-20 DIAGNOSIS — M79671 Pain in right foot: Secondary | ICD-10-CM | POA: Diagnosis not present

## 2014-04-20 DIAGNOSIS — M79672 Pain in left foot: Secondary | ICD-10-CM | POA: Diagnosis not present

## 2014-04-20 DIAGNOSIS — B351 Tinea unguium: Secondary | ICD-10-CM | POA: Diagnosis not present

## 2014-04-20 LAB — CBC AND DIFFERENTIAL
HCT: 40 % — AB (ref 41–53)
Hemoglobin: 13.4 g/dL — AB (ref 13.5–17.5)
Platelets: 277 10*3/uL (ref 150–399)
WBC: 7.3 10^3/mL

## 2014-04-20 LAB — TSH: TSH: 4.29 u[IU]/mL (ref 0.41–5.90)

## 2014-06-01 ENCOUNTER — Encounter: Payer: Self-pay | Admitting: Adult Health

## 2014-06-01 ENCOUNTER — Non-Acute Institutional Stay (SKILLED_NURSING_FACILITY): Payer: Medicare Other | Admitting: Adult Health

## 2014-06-01 DIAGNOSIS — F0281 Dementia in other diseases classified elsewhere with behavioral disturbance: Secondary | ICD-10-CM

## 2014-06-01 DIAGNOSIS — K5901 Slow transit constipation: Secondary | ICD-10-CM

## 2014-06-01 DIAGNOSIS — K4090 Unilateral inguinal hernia, without obstruction or gangrene, not specified as recurrent: Secondary | ICD-10-CM

## 2014-06-01 DIAGNOSIS — R569 Unspecified convulsions: Secondary | ICD-10-CM

## 2014-06-01 DIAGNOSIS — F02818 Dementia in other diseases classified elsewhere, unspecified severity, with other behavioral disturbance: Secondary | ICD-10-CM

## 2014-06-01 NOTE — Progress Notes (Signed)
Patient ID: [name], male   DOB: June 30, 1922, 81 y.o.   MRN: [id number]     Nursing Home Location:  [facility]   Code Status: DNR   Place of Service: SNF (31)  Chief Complaint  Patient presents with  . Medical Management of Chronic Issues    HPI:  81 y.o.  Male residing at [facility], memory care section. I am here to review his chronic medical issues.  VS have been stable over the past month. Weight has been stable at 138 lbs with some variation due to the scales(?)  He has advanced dementia with an altered presentation and was never diagnosed by neuro as AD. He currently is non verbal and wanders through out the unit and is resistant to care.  He has an inguinal hernia that we are monitoring and not intervening due to the advanced nature of his dementia, and how resistant he can be to medical intervention. The staff reports that he has not had any signs of pain, his appetite has not changed, and that he moves his bowels fairly regularly. The nurse stated that she held the miralax today due to loose stools two days but is planning to give it today.  Review of Systems:  Review of Systems  Unable to perform ROS: Dementia    Medications: Patient's Medications  New Prescriptions   No medications on file  Previous Medications   LACTOSE FREE NUTRITION (BOOST) LIQD    Take 237 mLs by mouth. Take once daily in the evening   MIRTAZAPINE (REMERON) 30 MG TABLET    Take 30 mg by mouth at bedtime.   OXCARBAZEPINE (TRILEPTAL) 150 MG TABLET    Take 150 mg by mouth 2 (two) times daily.   POLYETHYLENE GLYCOL (MIRALAX / GLYCOLAX) PACKET    Take 17 g by mouth daily as needed. Give 17 gram daily in 8 oz water or juice to prevent constipation.   SACCHAROMYCES BOULARDII (FLORASTOR) 250 MG CAPSULE    Take 250 mg by mouth 2 (two) times daily.   SUVOREXANT (BELSOMRA) 10 MG TABS    Take 10 mg by mouth at bedtime.   TEMAZEPAM (RESTORIL) 30 MG CAPSULE    Take 30  mg by mouth at bedtime as needed for sleep.  Modified Medications   No medications on file  Discontinued Medications   No medications on file     Physical Exam:  Filed Vitals:   06/01/14 1354  BP: 109/55  Pulse: 73  Temp: 96.2 F (35.7 C)  Resp: 20  Weight: 138 lb (62.596 kg)  SpO2: 92%    Physical Exam  Constitutional: No distress.  frail  HENT:  Head: Normocephalic and atraumatic.  Neck:  Resisted exam of his neck  Cardiovascular: Normal rate, regular rhythm and normal heart sounds.   No murmur heard. Pulmonary/Chest: Effort normal and breath sounds normal. No respiratory distress. He has no wheezes.  Abdominal: Soft. Normal appearance and bowel sounds are normal. He exhibits no distension. There is no tenderness.  Genitourinary:  Right inguinal hernia that is not reducible. TIt extends into the right scrotum. Appears to be larger in size from the previous exam. There is no tenderness noted or erythema. It is soft and not rigid.  Musculoskeletal:  Resisted musculoskeletal exam  Neurological: He is alert.  No obvious focal deficit. Not able to follow commands, not verbal. Slow shuffling gait. No tremor.  Skin: Skin is warm and dry. He is not diaphoretic.  Psychiatric:  Resistant to exam, somewhat agitated    Labs reviewed/Significant Diagnostic Results:  Basic Metabolic Panel:  Recent Labs  16/01/9603/02/15 01/05/14 02/23/14  NA 144 142 141  K 4.9 4.3 4.7  BUN 23* 18 16  CREATININE 0.8 0.8 0.7   Liver Function Tests:  Recent Labs  07/16/13  AST 17  ALT 9*  ALKPHOS 95   No results for input(s): LIPASE, AMYLASE in the last 8760 hours. No results for input(s): AMMONIA in the last 8760 hours. CBC:  Recent Labs  07/16/13 02/23/14 04/20/14  WBC 6.5 5.8 7.3  HGB 12.3* 12.9* 13.4*  HCT 36* 36* 40*  PLT 228 234 277    Assessment/Plan  1. 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 Currently on tegretol for  behaviors, with hx of seizures. Tolerating well, followed by Dr. Donell BeersPlovsky. Continues to remain resistant to care but able to function in his current environment  2. Seizures No new events, see above  3. Unilateral inguinal hernia without obstruction or gangrene, recurrence not specified Worsened over time, continue to monitor for signs of acute abd. I have discussed this with his son in the past and we have decided not to intervene surgically due to the behavioral component of his dementia and overall quality of life. Will check CBC and BMP.  4. Slow transit constipation Continue Miralax, due to the inguinal would continue and keep stools soft.  5. Insomina Continue Restoril and Belsomra  Peggye Leyhristy Chrissa Meetze, ANP [facility] [phone]

## 2014-06-02 DIAGNOSIS — K4090 Unilateral inguinal hernia, without obstruction or gangrene, not specified as recurrent: Secondary | ICD-10-CM | POA: Diagnosis not present

## 2014-06-02 LAB — BASIC METABOLIC PANEL
BUN: 21 mg/dL (ref 4–21)
Creatinine: 0.8 mg/dL (ref 0.6–1.3)
Glucose: 88 mg/dL
Potassium: 4.8 mmol/L (ref 3.4–5.3)
Sodium: 144 mmol/L (ref 137–147)

## 2014-06-02 LAB — CBC AND DIFFERENTIAL
HCT: 36 % — AB (ref 41–53)
Hemoglobin: 12.1 g/dL — AB (ref 13.5–17.5)
Platelets: 248 10*3/uL (ref 150–399)
WBC: 6.7 10*3/mL

## 2014-06-03 DIAGNOSIS — R300 Dysuria: Secondary | ICD-10-CM | POA: Diagnosis not present

## 2014-07-06 ENCOUNTER — Non-Acute Institutional Stay (SKILLED_NURSING_FACILITY): Payer: Medicare Other | Admitting: Adult Health

## 2014-07-06 DIAGNOSIS — R569 Unspecified convulsions: Secondary | ICD-10-CM | POA: Diagnosis not present

## 2014-07-06 DIAGNOSIS — F02818 Dementia in other diseases classified elsewhere, unspecified severity, with other behavioral disturbance: Secondary | ICD-10-CM

## 2014-07-06 DIAGNOSIS — F0281 Dementia in other diseases classified elsewhere with behavioral disturbance: Secondary | ICD-10-CM | POA: Diagnosis not present

## 2014-07-06 DIAGNOSIS — G4700 Insomnia, unspecified: Secondary | ICD-10-CM | POA: Diagnosis not present

## 2014-07-06 DIAGNOSIS — K4090 Unilateral inguinal hernia, without obstruction or gangrene, not specified as recurrent: Secondary | ICD-10-CM

## 2014-07-06 DIAGNOSIS — K5901 Slow transit constipation: Secondary | ICD-10-CM

## 2014-07-06 NOTE — Progress Notes (Signed)
Patient ID: [name], male   DOB: 07-01-1922, 81 y.o.   MRN: [id number]     Nursing Home Location:  [facility]   Code Status: DNR   Place of Service: SNF (31)  Chief Complaint  Patient presents with  . Medical Management of Chronic Issues    HPI:  81 y.o.  Male residing [facility], [facility] care section. I am here to review his chronic medical issues.  VS have been stable over the past month. Weight has been stable at 138 lbs.  He has advanced dementia with an altered presentation and was never diagnosed by neuro as AD. He currently is non verbal and wanders through out the unit and is resistant to care.  He has an inguinal hernia that we are monitoring and not intervening due to the advanced nature of his dementia, and how resistant he can be to medical intervention. The staff reports that he has not had any signs of pain, his appetite has not changed, and that he moves his bowels fairly regularly.  It was reported that he had purulent urine on 06/01/14 and he was placed on Cipro for 7 days. His urine returned with no growth.  There was a large amt of blood noted but this may have been due to trauma. No gross hematuria has been noted.   Review of Systems:  Review of Systems  Unable to perform ROS: Dementia    Medications: Patient's Medications  New Prescriptions   No medications on file  Previous Medications   LACTOSE FREE NUTRITION (BOOST) LIQD    Take 237 mLs by mouth. Take once daily in the evening   MIRTAZAPINE (REMERON) 30 MG TABLET    Take 30 mg by mouth at bedtime.   OXCARBAZEPINE (TRILEPTAL) 150 MG TABLET    Take 150 mg by mouth 2 (two) times daily.   POLYETHYLENE GLYCOL (MIRALAX / GLYCOLAX) PACKET    Take 17 g by mouth daily as needed. Give 17 gram daily in 8 oz water or juice to prevent constipation.   SACCHAROMYCES BOULARDII (FLORASTOR) 250 MG CAPSULE    Take 250 mg by mouth 2 (two) times daily.   SUVOREXANT (BELSOMRA) 10 MG  TABS    Take 10 mg by mouth at bedtime.   TEMAZEPAM (RESTORIL) 30 MG CAPSULE    Take 30 mg by mouth at bedtime as needed for sleep.  Modified Medications   No medications on file  Discontinued Medications   No medications on file     Physical Exam:  Filed Vitals:   07/06/14 1627  BP: 120/83  Pulse: 90  Temp: 96.4 F (35.8 C)  Resp: 18  Weight: 138 lb 3.2 oz (62.687 kg)  SpO2: 98%   Wt Readings from Last 3 Encounters:  07/06/14 138 lb 3.2 oz (62.687 kg)  06/01/14 138 lb (62.596 kg)  04/19/14 138 lb (62.596 kg)     Physical Exam  Constitutional: No distress.  frail  HENT:  Head: Normocephalic and atraumatic.  Neck:  Resisted exam of his neck  Cardiovascular: Normal rate, regular rhythm and normal heart sounds.   No murmur heard. Pulmonary/Chest: Effort normal and breath sounds normal. No respiratory distress. He has no wheezes.  Abdominal: Soft. Normal appearance and bowel sounds are normal. He exhibits no distension. There is no tenderness.  Genitourinary:  Right inguinal hernia that is not reducible. It extends into the right scrotum.  There is no tenderness noted or erythema. It is soft and  not rigid.  Musculoskeletal:  Resisted musculoskeletal exam  Neurological: He is alert.  No obvious focal deficit. Not able to follow commands, not verbal. Slow shuffling gait. No tremor.  Skin: Skin is warm and dry. He is not diaphoretic.  Psychiatric:  Resistant to exam, somewhat agitated    Labs reviewed/Significant Diagnostic Results:  Basic Metabolic Panel:  Recent Labs  16/10/96 01/05/14 02/23/14  NA 144 142 141  K 4.9 4.3 4.7  BUN 23* 18 16  CREATININE 0.8 0.8 0.7   Liver Function Tests:  Recent Labs  07/16/13  AST 17  ALT 9*  ALKPHOS 95   No results for input(s): LIPASE, AMYLASE in the last 8760 hours. No results for input(s): AMMONIA in the last 8760 hours. CBC:  Recent Labs  02/23/14 04/20/14 06/02/14  WBC 5.8 7.3 6.7  HGB 12.9* 13.4* 12.1*    HCT 36* 40* 36*  PLT 234 277 248    Assessment/Plan  1. 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 Currently on Truleptal for behaviors, with hx of seizures. Tolerating well, followed by Dr. Donell Beers. Continues to remain resistant to care but able to function in his current environment.   2. Seizures No new events, see above  3. Unilateral inguinal hernia without obstruction or gangrene, recurrence not specified Worsened over time, continue to monitor for signs of acute abd. I have discussed this with his son in the past and we have decided not to intervene surgically due to the behavioral component of his dementia and overall quality of life.  PVR noted at 40cc in Feb  4. Slow transit constipation Continue Miralax, due to the inguinal would continue and keep stools soft.  5. Insomnia Continue Restoril and Belsomra  Peggye Ley, ANP [facility] [phone]

## 2014-07-27 ENCOUNTER — Non-Acute Institutional Stay (SKILLED_NURSING_FACILITY): Payer: Medicare Other | Admitting: Adult Health

## 2014-07-27 ENCOUNTER — Encounter: Payer: Self-pay | Admitting: Adult Health

## 2014-07-27 DIAGNOSIS — H60392 Other infective otitis externa, left ear: Secondary | ICD-10-CM

## 2014-07-27 NOTE — Progress Notes (Signed)
Patient ID: Brandon PLATTEN, male   DOB: June 23, 1922, 81 y.o.   MRN: [id number]    Nursing Home Location:  [facility]   Code Status: DNR   Place of Service: SNF (31)  Chief Complaint  Patient presents with  . Acute Visit    left ear drainage    HPI:  81 y.o. male residing at [facility], memory care section. I was asked to see him today for left ear drainage that has lasted for 2-3 days. He has dementia and can not elaborate further. He does not appear to be in pain and has not had a fever. The staff has noticed purulent drainage from the left ear with erythema.     Review of Systems:  Review of Systems  Unable to perform ROS: Dementia    Medications: Patient's Medications  New Prescriptions   No medications on file  Previous Medications   LACTOSE FREE NUTRITION (BOOST) LIQD    Take 237 mLs by mouth. Take once daily in the evening   MIRTAZAPINE (REMERON) 30 MG TABLET    Take 30 mg by mouth at bedtime.   OXCARBAZEPINE (TRILEPTAL) 150 MG TABLET    Take 150 mg by mouth 2 (two) times daily.   POLYETHYLENE GLYCOL (MIRALAX / GLYCOLAX) PACKET    Take 17 g by mouth daily as needed. Give 17 gram daily in 8 oz water or juice to prevent constipation.   SACCHAROMYCES BOULARDII (FLORASTOR) 250 MG CAPSULE    Take 250 mg by mouth 2 (two) times daily.   SUVOREXANT (BELSOMRA) 10 MG TABS    Take 10 mg by mouth at bedtime.   TEMAZEPAM (RESTORIL) 30 MG CAPSULE    Take 30 mg by mouth at bedtime as needed for sleep.  Modified Medications   No medications on file  Discontinued Medications   No medications on file     Physical Exam:  Filed Vitals:   07/27/14 1038  Temp: 96.5 F (35.8 C)    Physical Exam  Constitutional: No distress.  HENT:  Head: Normocephalic and atraumatic.  Right Ear: External ear normal.  Left Ear: There is drainage. Tympanic membrane is not injected, not perforated and not erythematous.  Ears:  Small bony prominence noted  to posterior scalp  Eyes: Conjunctivae are normal. Pupils are equal, round, and reactive to light. Right eye exhibits no discharge. Left eye exhibits no discharge.  Neck: No JVD present. No tracheal deviation present. No thyromegaly present.  Lymphadenopathy:    He has no cervical adenopathy.  Skin: He is not diaphoretic.   He would not allow me to exam his mouth or nose. He was talking too loud to hear his lung sounds. The right ear was occluded with cerumen which he would not allow me to clear.   Labs reviewed/Significant Diagnostic Results:  Basic Metabolic Panel:  Recent Labs  08/65/78 02/23/14  NA 142 141  K 4.3 4.7  BUN 18 16  CREATININE 0.8 0.7   Liver Function Tests: No results for input(s): AST, ALT, ALKPHOS, BILITOT, PROT, ALBUMIN in the last 8760 hours. No results for input(s): LIPASE, AMYLASE in the last 8760 hours. No results for input(s): AMMONIA in the last 8760 hours. CBC:  Recent Labs  02/23/14 04/20/14 06/02/14  WBC 5.8 7.3 6.7  HGB 12.9* 13.4* 12.1*  HCT 36* 40* 36*  PLT 234 277 248   CBG: No results for input(s): GLUCAP in the last 8760 hours. TSH:  Recent Labs  04/20/14  TSH 4.29     Assessment/Plan  1. Otitis, externa, infective, left He has an allergy to PCN. He also needs a regimen that does not require frequent dosing due to his dementia, he is not able to sit still and follow commands for any period of time.Cipro HC 3 gtts to left eat BID for 7 days.      Peggye Leyhristy Williette Loewe, ANP [facility] [phone]

## 2014-08-03 ENCOUNTER — Non-Acute Institutional Stay (SKILLED_NURSING_FACILITY): Payer: Medicare Other | Admitting: Adult Health

## 2014-08-03 DIAGNOSIS — H60392 Other infective otitis externa, left ear: Secondary | ICD-10-CM

## 2014-08-03 DIAGNOSIS — S0181XA Laceration without foreign body of other part of head, initial encounter: Secondary | ICD-10-CM

## 2014-08-03 DIAGNOSIS — F0281 Dementia in other diseases classified elsewhere with behavioral disturbance: Secondary | ICD-10-CM

## 2014-08-03 DIAGNOSIS — I10 Essential (primary) hypertension: Secondary | ICD-10-CM

## 2014-08-03 DIAGNOSIS — F02818 Dementia in other diseases classified elsewhere, unspecified severity, with other behavioral disturbance: Secondary | ICD-10-CM

## 2014-08-03 DIAGNOSIS — K5901 Slow transit constipation: Secondary | ICD-10-CM

## 2014-08-03 NOTE — Progress Notes (Signed)
Patient ID: [name], male   DOB: Nov 04, 1922, 81 y.o.   MRN: [id number]     Nursing Home Location:  [facility]   Code Status: DNR   Place of Service: SNF (31)  Chief Complaint  Patient presents with  . Medical Management of Chronic Issues    HPI:  81 y.o.  Male residing at [facility], memory care section. I am here to review his chronic medical issues.  VS have been stable over the past month. Weight has been stable at 136 lbs. There are no concerns regarding his care today. He was treated with Cipro gtts to the left ear on 07/27/14 for otitis externa. He fell and hit his forehead on 4/12 and developed a small hematoma. His family did not want to send him to the ER, as his goals of care are comfort based.  He is ambulatory with assistance, mostly non verbal, and incontinent. He has advancing dementia that presented atypically and was not thought to be AD. He is no longer able to perform MMSE.  He has a hx of seizures but there are no new events per the staff. He has severe agitation and is followed by Dr. Donell Beers. He is currently on Trileptal and tolerating well. He has one on one caregivers 24/7. Of note, he has a right inguinal hernia that is incarcerated and protruding into the scrotal area. It is not strangulated and we are not pursuing further workup at this time due to his debility and dementia. He has no symptoms regarding this issue and the staff is monitoring the size of the hernia.  Review of Systems:  Review of Systems  Unable to perform ROS: Dementia    Medications: Patient's Medications  New Prescriptions   No medications on file  Previous Medications   LACTOSE FREE NUTRITION (BOOST) LIQD    Take 237 mLs by mouth. Take once daily in the evening   MIRTAZAPINE (REMERON) 30 MG TABLET    Take 30 mg by mouth at bedtime.   OXCARBAZEPINE (TRILEPTAL) 150 MG TABLET    Take 150 mg by mouth 2 (two) times daily.   POLYETHYLENE GLYCOL  (MIRALAX / GLYCOLAX) PACKET    Take 17 g by mouth daily as needed. Give 17 gram daily in 8 oz water or juice to prevent constipation.   SACCHAROMYCES BOULARDII (FLORASTOR) 250 MG CAPSULE    Take 250 mg by mouth 2 (two) times daily.   SUVOREXANT (BELSOMRA) 10 MG TABS    Take 10 mg by mouth at bedtime.   TEMAZEPAM (RESTORIL) 30 MG CAPSULE    Take 30 mg by mouth at bedtime as needed for sleep.  Modified Medications   No medications on file  Discontinued Medications   No medications on file     Physical Exam:  Filed Vitals:   08/03/14 1053  BP: 131/53  Pulse: 58  Temp: 96.7 F (35.9 C)  Resp: 17  Weight: 136 lb (61.689 kg)  SpO2: 95%   Wt Readings from Last 3 Encounters:  08/03/14 136 lb (61.689 kg)  07/06/14 138 lb 3.2 oz (62.687 kg)  06/01/14 138 lb (62.596 kg)     Physical Exam  Constitutional: No distress.  frail  HENT:  Left Ear: External ear normal.  Forehead hematoma with healing laceration  Eyes: Pupils are equal, round, and reactive to light. Right eye exhibits no discharge. Left eye exhibits no discharge.  Neck:  Resisted exam of his neck  Cardiovascular: Normal rate, regular  rhythm and normal heart sounds.   No murmur heard. Pulmonary/Chest: Effort normal and breath sounds normal. No respiratory distress. He has no wheezes.  Abdominal: Soft. Normal appearance and bowel sounds are normal. He exhibits no distension. There is no tenderness.  Musculoskeletal:  Resisted musculoskeletal exam  Neurological: He is alert.  No obvious focal deficit. Not able to follow commands, not verbal. Slow shuffling gait. No tremor.  Skin: Skin is warm and dry. He is not diaphoretic.  Psychiatric:  Resistant to exam, somewhat agitated    Labs reviewed/Significant Diagnostic Results:  Basic Metabolic Panel:  Recent Labs  98/02/9108/22/15 02/23/14 06/02/14  NA 142 141 144  K 4.3 4.7 4.8  BUN 18 16 21   CREATININE 0.8 0.7 0.8   Liver Function Tests: No results for input(s): AST,  ALT, ALKPHOS, BILITOT, PROT, ALBUMIN in the last 8760 hours. No results for input(s): LIPASE, AMYLASE in the last 8760 hours. No results for input(s): AMMONIA in the last 8760 hours. CBC:  Recent Labs  02/23/14 04/20/14 06/02/14  WBC 5.8 7.3 6.7  HGB 12.9* 13.4* 12.1*  HCT 36* 40* 36*  PLT 234 277 248    Assessment/Plan  1. 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 Not on meds due to allergies and the advanced nature of his disease. Continue supportive care. Goals of care are comfort.  2. Slow transit constipation Stable, continue Miralax  3. Otitis, externa, infective, left Improved, continue Cipro to complete 7 days  4. Forehead laceration, initial encounter Healing. Continue steri strips and dressing changes until healed. No s/s of infection.  5. HTN Stable, not on meds. Goal BP 150/90 or less due to advanced age.    Peggye Leyhristy Donnavin Vandenbrink, ANP [facility] [phone]

## 2014-08-03 NOTE — Progress Notes (Signed)
The resident has advanced dementia and is not able to answer questions or receive education

## 2014-08-24 DIAGNOSIS — L84 Corns and callosities: Secondary | ICD-10-CM | POA: Diagnosis not present

## 2014-08-24 DIAGNOSIS — M79672 Pain in left foot: Secondary | ICD-10-CM | POA: Diagnosis not present

## 2014-08-24 DIAGNOSIS — B351 Tinea unguium: Secondary | ICD-10-CM | POA: Diagnosis not present

## 2014-08-24 DIAGNOSIS — M79671 Pain in right foot: Secondary | ICD-10-CM | POA: Diagnosis not present

## 2014-08-26 ENCOUNTER — Encounter: Payer: Self-pay | Admitting: Adult Health

## 2014-08-26 ENCOUNTER — Non-Acute Institutional Stay (SKILLED_NURSING_FACILITY): Payer: Medicare Other | Admitting: Adult Health

## 2014-08-26 DIAGNOSIS — S8001XA Contusion of right knee, initial encounter: Secondary | ICD-10-CM

## 2014-08-26 NOTE — Progress Notes (Signed)
Patient ID: Rocky Linkdwin F Hollis, male   DOB: 03/22/1923, 81 y.o.   MRN: [id number]    Nursing Home Location:  [facility]   Code Status: DNR  Place of Service: SNF (31) memory care  Chief Complaint  Patient presents with  . Acute Visit    s/p fall, knee pain    HPI  81 y.[facility]. [facility]. I was asked to see him today due to right knee pain after mechanical fall today. He apparently tripped and fell down lightly hitting his right knee.  Initially he had edema and bruising to the area but over the course of a few hrs the edema has improved. He is able to walk on his knee without difficulty. He has advanced dementia and can not contribute to the HPI but does not appear to be in pain.     Review of Systems:  Review of Systems  Unable to perform ROS: Dementia    Medications: Patient's Medications  New Prescriptions   No medications on file  Previous Medications   LACTOSE FREE NUTRITION (BOOST) LIQD    Take 237 mLs by mouth. Take once daily in the evening   MIRTAZAPINE (REMERON) 30 MG TABLET    Take 30 mg by mouth at bedtime.   OXCARBAZEPINE (TRILEPTAL) 150 MG TABLET    Take 150 mg by mouth 2 (two) times daily.   POLYETHYLENE GLYCOL (MIRALAX / GLYCOLAX) PACKET    Take 17 g by mouth daily as needed. Give 17 gram daily in 8 oz water or juice to prevent constipation.   SACCHAROMYCES BOULARDII (FLORASTOR) 250 MG CAPSULE    Take 250 mg by mouth 2 (two) times daily.   SUVOREXANT (BELSOMRA) 10 MG TABS    Take 10 mg by mouth at bedtime.   TEMAZEPAM (RESTORIL) 30 MG CAPSULE    Take 30 mg by mouth at bedtime as needed for sleep.  Modified Medications   No medications on file  Discontinued Medications   No medications on file     Physical Exam:  Filed Vitals:   08/26/14 1532  BP: 151/79  Pulse: 64  Temp: 97 F (36.1 C)  Resp: 18  SpO2: 94%    Physical Exam  Constitutional: No distress.  Musculoskeletal: Normal range  of motion. He exhibits no edema or tenderness.  Right knee with mild ecchymoses but no erythema, or edema. No signs of dislocation with deformity.   Neurological: He is alert.  Not able to f/c at baseline, no obvious focal deficit  Skin: He is not diaphoretic.  Psychiatric:  Flat affect, minimally verbal    Labs reviewed/Significant Diagnostic Results:  Basic Metabolic Panel:  Recent Labs  40/98/1109/22/15 02/23/14 06/02/14  NA 142 141 144  K 4.3 4.7 4.8  BUN 18 16 21   CREATININE 0.8 0.7 0.8   Liver Function Tests: No results for input(s): AST, ALT, ALKPHOS, BILITOT, PROT, ALBUMIN in the last 8760 hours. No results for input(s): LIPASE, AMYLASE in the last 8760 hours. No results for input(s): AMMONIA in the last 8760 hours. CBC:  Recent Labs  02/23/14 04/20/14 06/02/14  WBC 5.8 7.3 6.7  HGB 12.9* 13.4* 12.1*  HCT 36* 40* 36*  PLT 234 277 248   CBG: No results for input(s): GLUCAP in the last 8760 hours. TSH:  Recent Labs  04/20/14  TSH 4.29       Assessment/Plan   1) Right knee ecchymosis Resolving, does not appear fractured. Can use tylenol  650mg  q 6 hrs pain. If he has difficulty walking or worsening pain we will obtain an xray.  Peggye Leyhristy Irvin Lizama, ANP [facility] [phone]

## 2014-08-31 DIAGNOSIS — R569 Unspecified convulsions: Secondary | ICD-10-CM | POA: Diagnosis not present

## 2014-09-07 DIAGNOSIS — R197 Diarrhea, unspecified: Secondary | ICD-10-CM | POA: Diagnosis not present

## 2014-09-08 DIAGNOSIS — R197 Diarrhea, unspecified: Secondary | ICD-10-CM | POA: Diagnosis not present

## 2014-09-08 NOTE — Progress Notes (Signed)
This encounter was created in error - please disregard.

## 2014-09-09 ENCOUNTER — Encounter: Payer: Self-pay | Admitting: Adult Health

## 2014-09-09 ENCOUNTER — Non-Acute Institutional Stay (SKILLED_NURSING_FACILITY): Payer: Medicare Other | Admitting: Adult Health

## 2014-09-09 DIAGNOSIS — K5901 Slow transit constipation: Secondary | ICD-10-CM | POA: Diagnosis not present

## 2014-09-09 DIAGNOSIS — F0281 Dementia in other diseases classified elsewhere with behavioral disturbance: Secondary | ICD-10-CM

## 2014-09-09 DIAGNOSIS — K4090 Unilateral inguinal hernia, without obstruction or gangrene, not specified as recurrent: Secondary | ICD-10-CM | POA: Diagnosis not present

## 2014-09-09 DIAGNOSIS — F02818 Dementia in other diseases classified elsewhere, unspecified severity, with other behavioral disturbance: Secondary | ICD-10-CM

## 2014-09-09 DIAGNOSIS — G4700 Insomnia, unspecified: Secondary | ICD-10-CM | POA: Diagnosis not present

## 2014-09-09 NOTE — Progress Notes (Signed)
Patient ID: [name], male   DOB: [birth year], 81 y.o.   MRN: [id number]     Nursing Home Location:  [facility]   Code Status: DNR   Place of Service: SNF (31)  Chief Complaint  Patient presents with  . Medical Management of Chronic Issues    HPI:  81 y.o.  Male residing at [facility], memory care section. I am here to review his chronic medical issues. He has a hx of a rapidly progressing dementia and currently remains ambulatory with behavioral issues. He is verbal but not able to answer questions.  VS have been stable over the past month. Weight has been stable at 136 lbs. He has diarrhea earlier in the week and two specimens for cdiff were sent which returned negative. He has not had any fever or abd pain. The diarrhea is resolving and an order for miralax is on hold.   Of note, he has a right inguinal hernia that is incarcerated and protruding into the scrotal area. It is not strangulated and we are not pursuing further workup at this time due to his debility and dementia. He has no symptoms regarding this issue and the staff is monitoring the size of the hernia. He has a hx of frequent falls. He most recently fell down on his right knee which resulted in some swelling and pain but resolved rather quickly two weeks ago. He has issues with night time awakenings and Belsomra was increased to 10 mg nightly on 07/21/14.  There is no info at this time to review regarding his sleeping log.  Review of Systems:  Review of Systems  Unable to perform ROS: Dementia    Medications: Patient's Medications  New Prescriptions   No medications on file  Previous Medications   LACTOSE FREE NUTRITION (BOOST) LIQD    Take 237 mLs by mouth. Take once daily in the evening   MIRTAZAPINE (REMERON) 30 MG TABLET    Take 30 mg by mouth at bedtime.   OXCARBAZEPINE (TRILEPTAL) 300 MG TABLET    Take 300 mg by mouth 2 (two) times daily.  in the am and 600 mg in  the pm   POLYETHYLENE GLYCOL (MIRALAX / GLYCOLAX) PACKET    Take 17 g by mouth daily as needed. Give 17 gram daily in 8 oz water or juice to prevent constipation.   SACCHAROMYCES BOULARDII (FLORASTOR) 250 MG CAPSULE    Take 250 mg by mouth 2 (two) times daily.   SUVOREXANT (BELSOMRA) 10 MG TABS    Take 10 mg by mouth at bedtime.   TEMAZEPAM (RESTORIL) 30 MG CAPSULE    Take 30 mg by mouth at bedtime as needed for sleep.  Modified Medications   No medications on file  Discontinued Medications   OXCARBAZEPINE (TRILEPTAL) 150 MG TABLET    Take 150 mg by mouth 2 (two) times daily.     Physical Exam:  Filed Vitals:   09/09/14 1058  BP: 116/51  Pulse: 65  Temp: 97 F (36.1 C)  Resp: 20  Weight: 136 lb (61.689 kg)  SpO2: 96%   Wt Readings from Last 3 Encounters:  09/09/14 136 lb (61.689 kg)  08/03/14 136 lb (61.689 kg)  07/06/14 138 lb 3.2 oz (62.687 kg)     Physical Exam  Constitutional: No distress.  frail  Neck:  Resisted exam of his neck  Cardiovascular: Normal rate, regular rhythm and normal heart sounds.   No murmur heard. Pulmonary/Chest: Effort normal and  breath sounds normal. No respiratory distress. He has no wheezes.  Difficult auscultate due to frequent verbalization and kicking me  Abdominal: Soft. Normal appearance and bowel sounds are normal. He exhibits no distension. There is no tenderness.  Genitourinary:  Right inguinal hernia protruding to the scrotum that is non reducible. No discoloration, swelling, drainage, or tenderness  Musculoskeletal:  Resisted musculoskeletal exam  Neurological: He is alert.  No obvious focal deficit. Not able to follow commands, not verbal. Slow shuffling gait. No tremor.  Skin: Skin is warm and dry. He is not diaphoretic.  Psychiatric:  Resistant to exam, agitated    Labs reviewed/Significant Diagnostic Results:  Basic Metabolic Panel:  Recent Labs  16/01/9608/22/15 02/23/14 06/02/14  NA 142 141 144  K 4.3 4.7 4.8  BUN 18 16  21   CREATININE 0.8 0.7 0.8   Liver Function Tests: No results for input(s): AST, ALT, ALKPHOS, BILITOT, PROT, ALBUMIN in the last 8760 hours. No results for input(s): LIPASE, AMYLASE in the last 8760 hours. No results for input(s): AMMONIA in the last 8760 hours. CBC:  Recent Labs  02/23/14 04/20/14 06/02/14  WBC 5.8 7.3 6.7  HGB 12.9* 13.4* 12.1*  HCT 36* 40* 36*  PLT 234 277 248  08/31/14 trileptal level 16.3  Assessment/Plan  1. 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 Not on meds due to allergies and the advanced nature of his disease. Continue supportive care. Goals of care are comfort.  2. Slow transit constipation Change miralax to prn due to diarrhea  3. Diarrhea resolving  4. Frequent falls Caregivers 24/7, fall precautions.  Avoid prn meds for agitation unless absolutely necessary  5. Inguinal hernia Stable, no change  6. Insomnia Staff will chart in the computer his sleeping log so that we can assess his response to Belsomra   Peggye Leyhristy Jaleiah Asay, ANP [facility] [phone]

## 2014-11-09 ENCOUNTER — Non-Acute Institutional Stay (SKILLED_NURSING_FACILITY): Payer: Medicare Other | Admitting: Internal Medicine

## 2014-11-09 ENCOUNTER — Encounter: Payer: Self-pay | Admitting: Internal Medicine

## 2014-11-09 DIAGNOSIS — K5901 Slow transit constipation: Secondary | ICD-10-CM

## 2014-11-09 DIAGNOSIS — R634 Abnormal weight loss: Secondary | ICD-10-CM

## 2014-11-09 DIAGNOSIS — F0281 Dementia in other diseases classified elsewhere with behavioral disturbance: Secondary | ICD-10-CM | POA: Diagnosis not present

## 2014-11-09 DIAGNOSIS — G4700 Insomnia, unspecified: Secondary | ICD-10-CM

## 2014-11-09 DIAGNOSIS — F02818 Dementia in other diseases classified elsewhere, unspecified severity, with other behavioral disturbance: Secondary | ICD-10-CM

## 2014-11-09 DIAGNOSIS — K4090 Unilateral inguinal hernia, without obstruction or gangrene, not specified as recurrent: Secondary | ICD-10-CM

## 2014-11-09 DIAGNOSIS — Z8679 Personal history of other diseases of the circulatory system: Secondary | ICD-10-CM

## 2014-11-09 NOTE — Progress Notes (Signed)
Patient ID: [name], male   DOB: 03/08/1923, 81 y.o.   MRN: [id number]  Location:  [facility] (SNF) Provider:  Gwenith Spitz. Renato Gails, D.O., C.M.D.  Code Status:  DNR Goals of care: Advanced Directive information Does patient have an advance directive?: Yes, Type of Advance Directive: Living will;Out of facility DNR (pink MOST or yellow form), Pre-existing out of facility DNR order (yellow form or pink MOST form): Yellow form placed in chart (order not valid for inpatient use), Does patient want to make changes to advanced directive?: No - Patient declined  Chief Complaint  Patient presents with  . Medical Management of Chronic Issues    HPI:  81 yo white male long term memory care resident at [facility] was seen for medical mgt of his chronic diseases.  He has dementia with behaviors initially felt to be primary progressive aphasia but later thought more likely to be Alzheimer's disease and deafness interfering with communication.  He has not tolerated galantamine or aricept.  He is on trileptal for seizures and has had no recent seizure events.  He has difficulty with sleep and is on restoril, belsomra and remeron.  He's been losing weight and the remeron was meant to help with appetite, as well.  He has longstanding difficulty with a large inguinal hernia that was no longer reducible, but also has not had any signs of strangulation or gangrene.  Due to his advanced dementia, his family has opted to watch and wait on this.  Still does not really sleep well despite numerous meds.  When seen, he was wandering around with his caregiver with him.  He does not use an assistive device.  He is aphasic and deaf.    Review of Systems:  Review of Systems  Constitutional: Positive for weight loss. Negative for fever and chills.  HENT: Positive for hearing loss.   Eyes: Negative for blurred vision.  Gastrointestinal: Positive for constipation. Negative for blood in stool and melena.    Genitourinary:       No s/s of pain with hernia per staff  Musculoskeletal: Negative for falls.  Skin: Negative for rash.  Psychiatric/Behavioral: Positive for memory loss. The patient has insomnia.     Past Medical History  Diagnosis Date  . Loss of weight 12/05/2011  . Pain in joint, shoulder region 02/05/2011  . Neuralgia, neuritis, and radiculitis, unspecified 12/11/2010  . Unspecified venous (peripheral) insufficiency 09/01/2008  . Unspecified hypothyroidism 04/05/2008  . Rickets, active 04/05/2008  . Nocturia 11/25/2006  . Aphasia 05/27/2006  . Unspecified persistent mental disorders due to conditions classified elsewhere 12/10/2005  . 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 Unspecified constipation 12/10/2005  . Other and unspecified hyperlipidemia 07/25/2005  . Tension headache 07/25/2005  . Occlusion and stenosis of carotid artery without mention of cerebral infarction 07/16/2005  . Memory loss 06/25/2005  . Unspecified hearing loss 08/16/2004  . Insomnia, unspecified 07/05/2004  . Allergic rhinitis due to pollen 01/05/2004  . Depressive disorder, not elsewhere classified 12/22/2003  . Unspecified essential hypertension 12/22/2003  . Inguinal hernia without mention of obstruction or gangrene, unilateral or unspecified, (not specified as recurrent) 12/22/2003  . Diaphragmatic hernia without mention of obstruction or gangrene 12/22/2003  . Diverticulosis of colon (without mention of hemorrhage) 12/22/2003`  . Osteoarthrosis, unspecified whether generalized or localized, unspecified site 12/22/2003  . Scoliosis (and kyphoscoliosis), idiopathic 12/22/2003  . Edema 12/22/2003  . Hypertrophy of prostate without urinary obstruction and other lower urinary tract symptoms (LUTS) 12/30/2002  .  Osteoporosis, unspecified 08/21/2002  . Loss of weight   . Seizures 01/09/2013  . Primary progressive aphasia 05/27/2006    Seen for evaluation in January 2008 at [facility] in [place]. Primary Progressive Aphasia was considered unlikely and Alzheimer's was not verified or ruled out. Note was made of the need for a follow-up MRI of the brain, and deafness that interferes with communication. He subsequently had evaluations that led to recommendations for Speech Therapy to work  . Dementia due to another medical condition 06/25/2005    2013: Cognitive and functional status declined through the year. Transferred to Memory Care section 12/2011 due to difficulty managing in AL setting: wandering, increased confusion, increased need for assistance with ADLs. Patient will benefit from smaller/structured environment of Memory Care unit once he adjusts to the change. Type of dementia is unclear, pt has been tried on donezepil, galantami    Patient Active Problem List   Diagnosis Date Noted  . Inguinal hernia 04/02/2014  . Insomnia 03/25/2014  . Seizures 01/09/2013  . Occipital scalp laceration 01/02/2013  . HTN (hypertension) 01/02/2013  . Constipation 01/02/2013  . Urinary frequency 01/02/2013  . Subarachnoid hemorrhage following injury, without mention of open intracranial wound, no loss of consciousness 12/31/2012  . Subdural hemorrhage following injury, without mention of open intracranial wound, no loss of consciousness 12/31/2012  . Loss of weight   . 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 Dementia due to another medical condition; Primary Progressive Aphasia 06/25/2005  . Unspecified hearing loss 08/16/2004  . Depressive disorder, not elsewhere classified 12/22/2003  . Edema 12/22/2003    Allergies  Allergen Reactions  . Aricept [Donepezil Hcl]   . Galantamine Hydrobromide Nausea Only  . Namenda [Memantine Hcl] Nausea And Vomiting  . Penicillins Nausea Only    Medications: Patient's Medications  New Prescriptions   No medications on file  Previous Medications   LACTOSE FREE NUTRITION (BOOST) LIQD    Take 237  mLs by mouth. Take once daily in the evening   MIRTAZAPINE (REMERON) 30 MG TABLET    Take 30 mg by mouth at bedtime.   OXCARBAZEPINE (TRILEPTAL) 300 MG TABLET    Take 300 mg by mouth 2 (two) times daily. 300mg  in the am and 600 mg in the pm   POLYETHYLENE GLYCOL (MIRALAX / GLYCOLAX) PACKET    Take 17 g by mouth daily as needed. Give 17 gram daily in 8 oz water or juice to prevent constipation.   SACCHAROMYCES BOULARDII (FLORASTOR) 250 MG CAPSULE    Take 250 mg by mouth 2 (two) times daily.   SUVOREXANT (BELSOMRA) 10 MG TABS    Take 10 mg by mouth at bedtime.   TEMAZEPAM (RESTORIL) 30 MG CAPSULE    Take 30 mg by mouth at bedtime as needed for sleep.  Modified Medications   No medications on file  Discontinued Medications   No medications on file    Physical Exam: Filed Vitals:   11/09/14 1742  BP: 140/78  Pulse: 69  Temp: 98 F (36.7 C)  Resp: 20  Height: 5\' 7"  (1.702 m)  Weight: 142 lb 4.8 oz (64.547 kg)  SpO2: 94%   Body mass index is 22.28 kg/(m^2).  Physical Exam  Constitutional:  Frail white male  Cardiovascular: Normal rate, regular rhythm, normal heart sounds and intact distal pulses.   Pulmonary/Chest: Effort normal and breath sounds normal. No respiratory distress.  Abdominal: Soft. Bowel sounds are normal.  Genitourinary:  Large inguinal  hernia   Musculoskeletal:  Kyphotic posture, walks holding hands of caregivers  Neurological: He is alert.  wanders    Labs reviewed: Basic Metabolic Panel:  Recent Labs  16/10/96 02/23/14 06/02/14  NA 142 141 144  K 4.3 4.7 4.8  BUN 18 16 21   CREATININE 0.8 0.7 0.8    Liver Function Tests: No results for input(s): AST, ALT, ALKPHOS, BILITOT, PROT, ALBUMIN in the last 8760 hours.  CBC:  Recent Labs  02/23/14 04/20/14 06/02/14  WBC 5.8 7.3 6.7  HGB 12.9* 13.4* 12.1*  HCT 36* 40* 36*  PLT 234 277 248    Lab Results  Component Value Date   TSH 4.29 04/20/2014   Patient Care Team: Kermit Balo, DO as PCP -  General (Geriatric Medicine) [facility] Fletcher Anon, NP as Nurse Practitioner (Nurse Practitioner)  Assessment/Plan: 1. 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 History of subdural hematoma -occurred two years ago with fall and compounded his dementia which had initially been noted at least since 2007  3. Slow transit constipation -cont miralax as needed to prevent worsening of his hernia  4. Unilateral inguinal hernia without obstruction or gangrene, recurrence not specified -very large, but not causing him any notable pain, no strangulation or gangrene, cont to monitor due to goals of care  5. Insomnia -on multiple agents for this including low dose belsomra, restoril, and remeron -? Could consolidate by decreasing restoril and increasing belsomra -restoril also might worsen his cognitive state and increase his fall risk more than other agents  6. Loss of weight -due to advancing dementia, continuous wandering and poor intake -cont supplemental boost and feeding  Family/ staff Communication: discussed with nursing staff and caregiver  Labs/tests ordered:  No new due to goals of care  Madison Direnzo L. Gerardine Peltz, D.O. Geriatrics [facility] [address], [place] [address] Cell Phone (Mon-Fri 8am-5pm):  [phone] On Call:  [phone] & follow prompts after 5pm & weekends Office Phone:  [phone] Office Fax:  [phone]

## 2014-11-14 ENCOUNTER — Encounter: Payer: Self-pay | Admitting: Internal Medicine

## 2014-11-22 ENCOUNTER — Other Ambulatory Visit: Payer: Self-pay | Admitting: *Deleted

## 2014-11-22 MED ORDER — SUVOREXANT 10 MG PO TABS: 10.0000 mg | ORAL_TABLET | Freq: Every day | ORAL | Status: DC

## 2014-11-22 NOTE — Telephone Encounter (Signed)
[facility] 

## 2014-11-23 ENCOUNTER — Other Ambulatory Visit: Payer: Self-pay | Admitting: *Deleted

## 2014-11-23 MED ORDER — SUVOREXANT 10 MG PO TABS: 10.0000 mg | ORAL_TABLET | Freq: Every day | ORAL | Status: AC

## 2014-11-23 NOTE — Telephone Encounter (Signed)
[facility] 

## 2014-11-30 DIAGNOSIS — M79671 Pain in right foot: Secondary | ICD-10-CM | POA: Diagnosis not present

## 2014-11-30 DIAGNOSIS — L84 Corns and callosities: Secondary | ICD-10-CM | POA: Diagnosis not present

## 2014-11-30 DIAGNOSIS — M79672 Pain in left foot: Secondary | ICD-10-CM | POA: Diagnosis not present

## 2014-11-30 DIAGNOSIS — B351 Tinea unguium: Secondary | ICD-10-CM | POA: Diagnosis not present

## 2014-12-10 ENCOUNTER — Non-Acute Institutional Stay (SKILLED_NURSING_FACILITY): Payer: Medicare Other | Admitting: Adult Health

## 2014-12-10 ENCOUNTER — Encounter: Payer: Self-pay | Admitting: Adult Health

## 2014-12-10 DIAGNOSIS — K5901 Slow transit constipation: Secondary | ICD-10-CM

## 2014-12-10 DIAGNOSIS — F0281 Dementia in other diseases classified elsewhere with behavioral disturbance: Secondary | ICD-10-CM | POA: Diagnosis not present

## 2014-12-10 DIAGNOSIS — I10 Essential (primary) hypertension: Secondary | ICD-10-CM

## 2014-12-10 DIAGNOSIS — R569 Unspecified convulsions: Secondary | ICD-10-CM

## 2014-12-10 DIAGNOSIS — K4090 Unilateral inguinal hernia, without obstruction or gangrene, not specified as recurrent: Secondary | ICD-10-CM

## 2014-12-10 DIAGNOSIS — G4700 Insomnia, unspecified: Secondary | ICD-10-CM | POA: Diagnosis not present

## 2014-12-10 DIAGNOSIS — F02818 Dementia in other diseases classified elsewhere, unspecified severity, with other behavioral disturbance: Secondary | ICD-10-CM

## 2014-12-10 NOTE — Progress Notes (Signed)
Patient ID: [name], male   DOB: December 04, 1922, 81 y.o.   MRN: [id number]     Nursing Home Location:  [facility]   Code Status: DNR, most form   Place of Service: SNF (31)  Chief Complaint  Patient presents with  . Medical Management of Chronic Issues    HPI:  81 y.o.  Male residing at [facility], memory care section. I am here to review his chronic medical issues. He has a hx of a rapidly progressing dementia and currently remains ambulatory with behavioral issues. He is verbal but not able to answer questions.  VS have been stable over the past month. Weight has increased to 145 lbs with no signs of edema.  Of note, he has a right inguinal hernia that is incarcerated and protruding into the scrotal area. It is not strangulated and we are not pursuing further workup at this time due to his debility and dementia. He has no symptoms regarding this issue and the staff is monitoring the size of the hernia. They report that the area is progressively gotten bigger.   Review of Systems:  Review of Systems  Unable to perform ROS: Dementia    Medications: Patient's Medications  New Prescriptions   No medications on file  Previous Medications   LACTOSE FREE NUTRITION (BOOST) LIQD    Take 237 mLs by mouth. Take once daily in the evening   MIRTAZAPINE (REMERON) 30 MG TABLET    Take 30 mg by mouth at bedtime.   OXCARBAZEPINE (TRILEPTAL) 300 MG TABLET    Take 300 mg by mouth 2 (two) times daily. 300mg  in the am and 600 mg in the pm   POLYETHYLENE GLYCOL (MIRALAX / GLYCOLAX) PACKET    Take 17 g by mouth daily as needed. Give 17 gram daily in 8 oz water or juice to prevent constipation.   SUVOREXANT (BELSOMRA) 10 MG TABS    Take 10 mg by mouth at bedtime. For rest  Modified Medications   No medications on file  Discontinued Medications   SACCHAROMYCES BOULARDII (FLORASTOR) 250 MG CAPSULE    Take 250 mg by mouth 2 (two) times daily.   TEMAZEPAM  (RESTORIL) 30 MG CAPSULE    Take 30 mg by mouth at bedtime as needed for sleep.     Physical Exam:  Filed Vitals:   12/10/14 1051  BP: 138/67  Pulse: 64  Temp: 97 F (36.1 C)  Resp: 18  Weight: 145 lb (65.772 kg)  SpO2: 92%   Wt Readings from Last 3 Encounters:  12/10/14 145 lb (65.772 kg)  11/09/14 142 lb 4.8 oz (64.547 kg)  09/09/14 136 lb (61.689 kg)     Physical Exam  Constitutional: No distress.  frail  Neck:  Resisted exam of his neck  Cardiovascular: Normal rate, regular rhythm and normal heart sounds.   No murmur heard. Pulmonary/Chest: Effort normal and breath sounds normal. No respiratory distress. He has no wheezes.  Difficult auscultate due to frequent verbalization  Abdominal: Soft. Normal appearance and bowel sounds are normal. He exhibits no distension. There is no tenderness.  Genitourinary:  Right inguinal hernia protruding to the scrotum that is non reducible. No discoloration, swelling, drainage, or tenderness  Musculoskeletal:  Resisted musculoskeletal exam  Neurological: He is alert.  No obvious focal deficit. Not able to follow commands, not verbal. Slow shuffling gait. No tremor.  Skin: Skin is warm and dry. He is not diaphoretic.  Psychiatric:  Resistant to exam, agitated  Labs reviewed/Significant Diagnostic Results:  Basic Metabolic Panel:  Recent Labs  78/29/56 02/23/14 06/02/14  NA 142 141 144  K 4.3 4.7 4.8  BUN CREATININE 0.8 0.7 0.8   Liver Function Tests: No results for input(s): AST, ALT, ALKPHOS, BILITOT, PROT, ALBUMIN in the last 8760 hours. No results for input(s): LIPASE, AMYLASE in the last 8760 hours. No results for input(s): AMMONIA in the last 8760 hours. CBC:  Recent Labs  02/23/14 04/20/14 06/02/14  WBC 5.8 7.3 6.7  HGB 12.9* 13.4* 12.1*  HCT 36* 40* 36*  PLT 234 277 248  08/31/14 trileptal level 16.3  Assessment/Plan  1. Dementia due to another medical condition, with behavioral  disturbance Stable. Not on meds for memory due to allergies and the advanced nature of his disease. Continue supportive care. Goals of care are comfort. -continue trileptal and remeron per Dr. Donell Beers  2. HTN -not on meds with controlled BP -check labs  3. Constipation -no reports of this, has miralax prn  4. Insomnia -gets up at night sometimes per the staff -continue belsomra  5. Seizures -h/o of this with no reports of seizure activity  6. Inguinal hernia -slight worsening with no obvious symptoms but this is difficult to assess given his dementia -would prefer to keep stools on the looser side, staff reports stools are soft  Routine labs due TSH, CBC, BMP  Peggye Ley, ANP [facility] [phone]

## 2014-12-14 DIAGNOSIS — E079 Disorder of thyroid, unspecified: Secondary | ICD-10-CM | POA: Diagnosis not present

## 2014-12-14 DIAGNOSIS — K4090 Unilateral inguinal hernia, without obstruction or gangrene, not specified as recurrent: Secondary | ICD-10-CM | POA: Diagnosis not present

## 2014-12-14 DIAGNOSIS — J1000 Influenza due to other identified influenza virus with unspecified type of pneumonia: Secondary | ICD-10-CM | POA: Diagnosis not present

## 2014-12-14 LAB — BASIC METABOLIC PANEL
BUN: 18 mg/dL (ref 4–21)
CREATININE: 0.9 mg/dL (ref 0.6–1.3)
GLUCOSE: 91 mg/dL
Potassium: 5.2 mmol/L (ref 3.4–5.3)
Sodium: 144 mmol/L (ref 137–147)

## 2014-12-14 LAB — CBC AND DIFFERENTIAL
HCT: 36 % — AB (ref 41–53)
Hemoglobin: 12.1 g/dL — AB (ref 13.5–17.5)
PLATELETS: 220 10*3/uL (ref 150–399)
WBC: 6.5 10^3/mL

## 2014-12-14 LAB — TSH: TSH: 4.66 u[IU]/mL (ref 0.41–5.90)

## 2015-01-27 ENCOUNTER — Non-Acute Institutional Stay (SKILLED_NURSING_FACILITY): Payer: Medicare Other | Admitting: Adult Health

## 2015-01-27 ENCOUNTER — Encounter: Payer: Self-pay | Admitting: Adult Health

## 2015-01-27 DIAGNOSIS — G4700 Insomnia, unspecified: Secondary | ICD-10-CM | POA: Diagnosis not present

## 2015-01-27 DIAGNOSIS — F0281 Dementia in other diseases classified elsewhere with behavioral disturbance: Secondary | ICD-10-CM | POA: Diagnosis not present

## 2015-01-27 DIAGNOSIS — K089 Disorder of teeth and supporting structures, unspecified: Secondary | ICD-10-CM | POA: Diagnosis not present

## 2015-01-27 DIAGNOSIS — R569 Unspecified convulsions: Secondary | ICD-10-CM

## 2015-01-27 DIAGNOSIS — F02818 Dementia in other diseases classified elsewhere, unspecified severity, with other behavioral disturbance: Secondary | ICD-10-CM

## 2015-01-27 NOTE — Progress Notes (Signed)
Patient ID: [name], male   DOB: Aug 17, 1922, 81 y.o.   MRN: [id number]     Nursing Home Location:  [facility]   Code Status: DNR, most form   Place of Service: SNF (31)  Chief Complaint  Patient presents with  . Medical Management of Chronic Issues    HPI:  81 y.o.  [facility]. I am here to review his chronic medical issues. He has a hx of a rapidly progressing dementia and currently remains ambulatory with behavioral issues. He is verbal but not able to answer questions.  VS have been stable over the past month. Weight has increased to 147 lbs with no signs of edema.  Of note, he has a right inguinal hernia that is incarcerated and protruding into the scrotal area. It is not strangulated and we are not pursuing further workup at this time due to his debility and dementia. He has no symptoms regarding this issue and the staff is monitoring the size of the hernia. The staff reports that over the past week he has been napping more and eating less. He has not had a fever or other associated symptoms.   Review of Systems:  Review of Systems  Unable to perform ROS: Dementia    Medications: Patient's Medications  New Prescriptions   No medications on file  Previous Medications   LACTOSE FREE NUTRITION (BOOST) LIQD    Take 237 mLs by mouth. Take once daily in the evening   MIRTAZAPINE (REMERON) 30 MG TABLET    Take 30 mg by mouth at bedtime.   OXCARBAZEPINE (TRILEPTAL) 300 MG TABLET    Take 300 mg by mouth 2 (two) times daily. 300mg  in the am and 600 mg in the pm   POLYETHYLENE GLYCOL (MIRALAX / GLYCOLAX) PACKET    Take 17 g by mouth daily as needed. Give 17 gram daily in 8 oz water or juice to prevent constipation.   SUVOREXANT (BELSOMRA) 10 MG TABS    Take 10 mg by mouth at bedtime. For rest  Modified Medications   No medications on file  Discontinued Medications   No medications on file      Physical Exam:  Filed Vitals:   01/27/15 1542  BP: 120/57  Pulse: 77  Temp: 97.2 F (36.2 C)  Resp: 20  Weight: 147 lb (66.679 kg)  SpO2: 94%   Wt Readings from Last 3 Encounters:  01/27/15 147 lb (66.679 kg)  12/10/14 145 lb (65.772 kg)  11/09/14 142 lb 4.8 oz (64.547 kg)     Physical Exam  Constitutional: No distress.  frail  HENT:  Not able to look in the oropharynx.  Very poor dentition  Cardiovascular: Normal rate, regular rhythm and normal heart sounds.   No murmur heard. Pulmonary/Chest: Effort normal and breath sounds normal. No respiratory distress. He has no wheezes.  Abdominal: Soft. Normal appearance and bowel sounds are normal. He exhibits no distension. There is no tenderness.  Musculoskeletal:  Resisted musculoskeletal exam  Lymphadenopathy:    He has no cervical adenopathy.  Neurological: He is alert.  No obvious focal deficit. Not able to follow commands, not verbal. Slow shuffling gait. No tremor.  Skin: Skin is warm and dry. He is not diaphoretic.  Psychiatric:  Resistant to exam, agitated    Labs reviewed/Significant Diagnostic Results:  Basic Metabolic Panel:  Recent Labs  13/24/4010/01/28 06/02/14  NA 141 144  K 4.7 4.8  BUN 16 21  CREATININE 0.7 0.8   Liver Function Tests: No results for input(s): AST, ALT, ALKPHOS, BILITOT, PROT, ALBUMIN in the last 8760 hours. No results for input(s): LIPASE, AMYLASE in the last 8760 hours. No results for input(s): AMMONIA in the last 8760 hours. CBC:  Recent Labs  02/23/14 04/20/14 06/02/14  WBC 5.8 7.3 6.7  HGB 12.9* 13.4* 12.1*  HCT 36* 40* 36*  PLT 234 277 248   Lab Results  Component Value Date   TSH 4.66 12/14/2014    Assessment/Plan  1. 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 Continue supportive care. Goals of care are comfort. -continue trileptal and remeron per Dr.  Donell Beers -sleeping more now, the nurse is going to assess his sleep wake cycle and behaviors over the next few days and report if too sleepy  2. Insomnia -improved per staff -continue belsomra, see above  3. Seizures -h/o of this with no reports of seizure activity  4. Poor dentition -His family has elected not to receive any more dental care due to the risk involved and his agitation during visits -no acute infections are noted that would be contributing to his decreased appetite but will continue to monitor     Peggye Ley, ANP [facility] [phone]

## 2015-01-31 ENCOUNTER — Encounter: Payer: Self-pay | Admitting: Adult Health

## 2015-01-31 ENCOUNTER — Non-Acute Institutional Stay (SKILLED_NURSING_FACILITY): Payer: Medicare Other | Admitting: Adult Health

## 2015-01-31 DIAGNOSIS — F0281 Dementia in other diseases classified elsewhere with behavioral disturbance: Secondary | ICD-10-CM

## 2015-01-31 DIAGNOSIS — K4091 Unilateral inguinal hernia, without obstruction or gangrene, recurrent: Secondary | ICD-10-CM

## 2015-01-31 DIAGNOSIS — F02818 Dementia in other diseases classified elsewhere, unspecified severity, with other behavioral disturbance: Secondary | ICD-10-CM

## 2015-01-31 DIAGNOSIS — R109 Unspecified abdominal pain: Secondary | ICD-10-CM | POA: Diagnosis not present

## 2015-01-31 DIAGNOSIS — R509 Fever, unspecified: Secondary | ICD-10-CM | POA: Diagnosis not present

## 2015-01-31 NOTE — Progress Notes (Signed)
Patient ID: [name], male   DOB: [birth year], 81 y.o.   MRN: [id number]     Nursing Home Location:  [facility]   Code Status: DNR, most form   Place of Service: SNF (31)  Chief Complaint  Patient presents with  . Acute Visit    pain, not eating    HPI:  81 y.o.  Male residing at [facility], memory care section. . He has a hx of a rapidly progressing dementia, was fully functional in 2013 per the nurse.  I was asked to see him by the staff due to complaints of not eating, weakness, and s/s of pain with moaning. He has a temp of 100.8 with no cough.  Of note, he has a large right inguinal hernia that was incarcerated and protruding into the scrotal area.  We did not pursue further workup at the time due to his debility and dementia.Today the hernia has reduced.  He is not eating and is moaning.  He has been given 5 mg of Roxanol for pain and this has helped to a degree but still he has a grimace to his face. He is not verbal and can not contribute to the hx. He has poor dentition with multiple caries but his family opted not to continue with dental care due to his agitation with personal care.    Review of Systems:  Review of Systems  Unable to perform ROS: Dementia    Medications: Patient's Medications  New Prescriptions   No medications on file  Previous Medications   LACTOSE FREE NUTRITION (BOOST) LIQD    Take 237 mLs by mouth. Take once daily in the evening   MIRTAZAPINE (REMERON) 30 MG TABLET    Take 30 mg by mouth at bedtime.   OXCARBAZEPINE (TRILEPTAL) 300 MG TABLET    Take 300 mg by mouth 2 (two) times daily.  in the am and 600 mg in the pm   POLYETHYLENE GLYCOL (MIRALAX / GLYCOLAX) PACKET    Take 17 g by mouth daily as needed. Give 17 gram daily in 8 oz water or juice to prevent constipation.   SUVOREXANT (BELSOMRA) 10 MG TABS    Take 10 mg by mouth at bedtime. For rest  Modified Medications   No medications on file    Discontinued Medications   No medications on file     Physical Exam:  There were no vitals filed for this visit. Wt Readings from Last 3 Encounters:  01/27/15 147 lb (66.679 kg)  12/10/14 145 lb (65.772 kg)  11/09/14 142 lb 4.8 oz (64.547 kg)     Physical Exam  Constitutional: No distress.  frail  HENT:  Not able to look in the oropharynx.  Very poor dentition  Cardiovascular: Normal rate, regular rhythm and normal heart sounds.   No murmur heard. Pulmonary/Chest: Effort normal and breath sounds normal. No respiratory distress. He has no wheezes.  Abdominal: Soft. Normal appearance and bowel sounds are normal. He exhibits no distension and no mass.  Grimaces when abd is palpated  Musculoskeletal:  Resisted musculoskeletal exam  Lymphadenopathy:    He has no cervical adenopathy.  Neurological: He is alert.  No obvious focal deficit. Not able to follow commands, not verbal.  Skin: Skin is warm and dry. He is not diaphoretic.  Psychiatric:  Resistant to exam, agitated    Labs reviewed/Significant Diagnostic Results:  Basic Metabolic Panel:  Recent Labs  82/95/62 06/02/14 12/14/14  NA 141 144 144  K 4.7 4.8 5.2  BUN 16 21 18   CREATININE 0.7 0.8 0.9   Liver Function Tests: No results for input(s): AST, ALT, ALKPHOS, BILITOT, PROT, ALBUMIN in the last 8760 hours. No results for input(s): LIPASE, AMYLASE in the last 8760 hours. No results for input(s): AMMONIA in the last 8760 hours. CBC:  Recent Labs  04/20/14 06/02/14 12/14/14  WBC 7.3 6.7 6.5  HGB 13.4* 12.1* 12.1*  HCT 40* 36* 36*  PLT 277 248 220   Lab Results  Component Value Date   TSH 4.66 12/14/2014    Assessment/Plan  1) Inguinal hernia  -spontaneous reduction -?small bowel obstruction or volvulus -he has no n, v, diarrhea with a normal abd exam but appears in pain -previous discussion with the family led to goals of care to be comfort based due to his dementia with agitation, as well as  poor quality of life -Begin roxanol 10 mg q 2 hrs prn pain   2)Fever -may be due to #1 -After a discussion with his son, [name], we have decided not to work up this issue. Unfortunately, [name] is not swallowing and has agitation so if he was given IV antibiotics he would most likely pull out the IV. Prior to this his quality of life was poor. We will try to keep him comfortable during this time and keep his family updated. His son is in [place] and will try to be here on Wednesday.  3) Dementia -end stage with decreased intake and no longer ambulating.  -see above  I spent > 50% of my time in counseling and coordination of care with this resident. His overall prognosis is poor    Peggye Leyhristy Cully Luckow, ANP [facility] [phone]

## 2015-02-15 DEATH — deceased

## 2015-03-29 NOTE — Progress Notes (Signed)
This encounter was created in error - please disregard.

## 2015-06-30 IMAGING — CT CT HEAD W/O CM
3 of 11 series · 10 of 30 positions shown, 11 images · non-contrast
Comparison: Brain MRI from 06/03/2006

CT HEAD

CLINICAL DATA: Status post fall.

CT HEAD WITHOUT CONTRAST
CT CERVICAL SPINE WITHOUT CONTRAST
TECHNIQUE: Multidetector CT imaging of the head and cervical spine
was performed following the standard protocol without intravenous
contrast.  Multiplanar CT image reconstructions of the cervical
spine were also generated.

[Series 10: c-spine st · axial · 0.45mm/px · z∈[+1308,+1404]mm · 4 of 80 slices shown]
[im 16/80  brain]
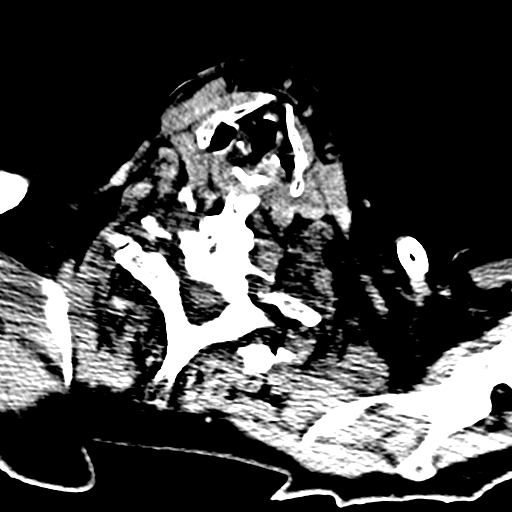
[im 32/80  brain]
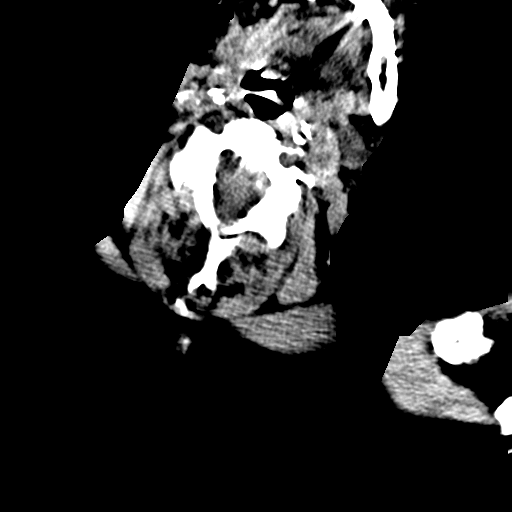
[im 48/80  brain]
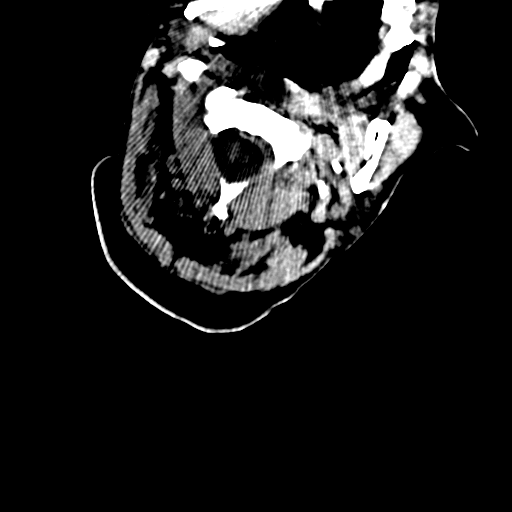
[im 64/80  brain]
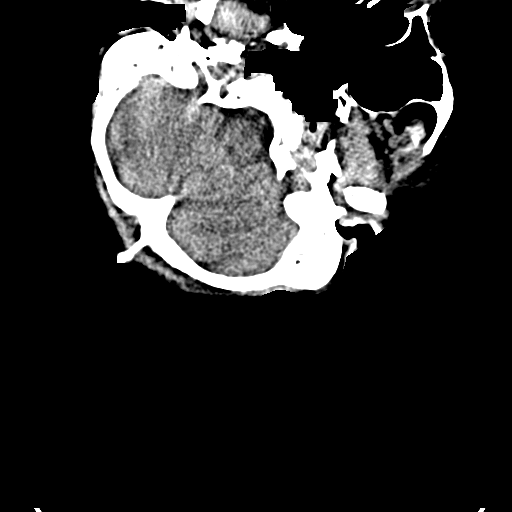

[Series 13: axial recon · axial · 0.28mm/px · z∈[+1247,+1323]mm · 4 of 78 slices shown, 5 images]
[im 16/78  brain]
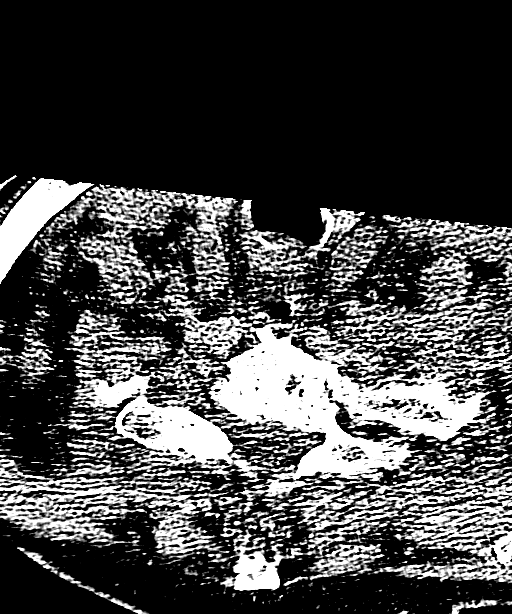
[im 16/78  bone]
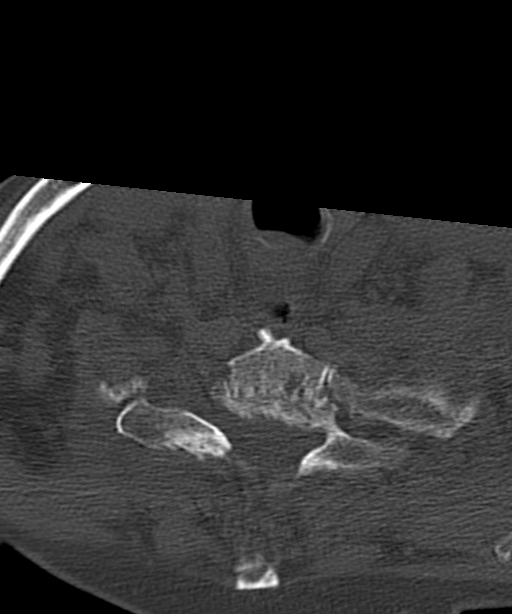
[im 31/78  brain]
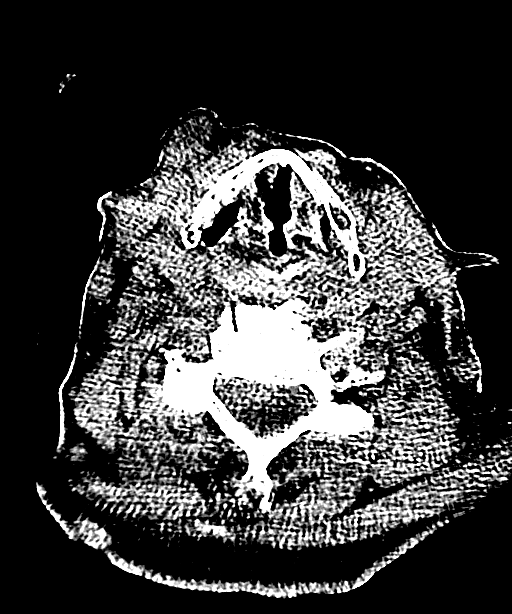
[im 47/78  brain]
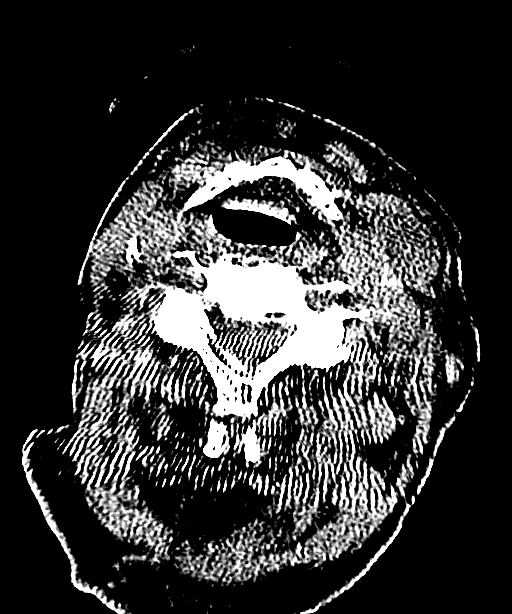
[im 62/78  brain]
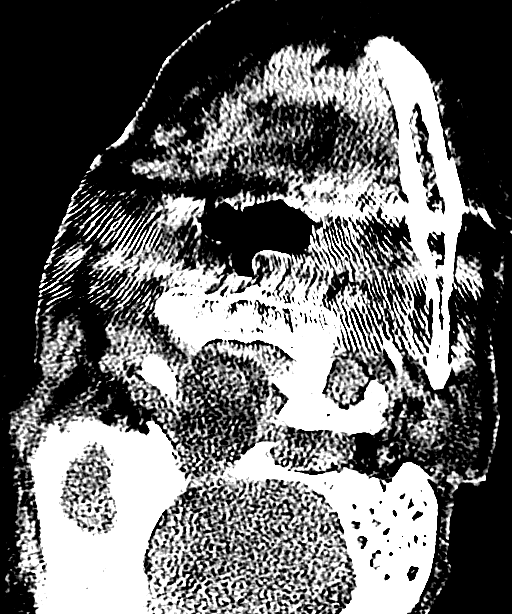

[Series 16: bone windows · axial · 0.47mm/px · z∈[+1452,+1500]mm · 2 of 50 slices shown]
[im 17/50  bone]
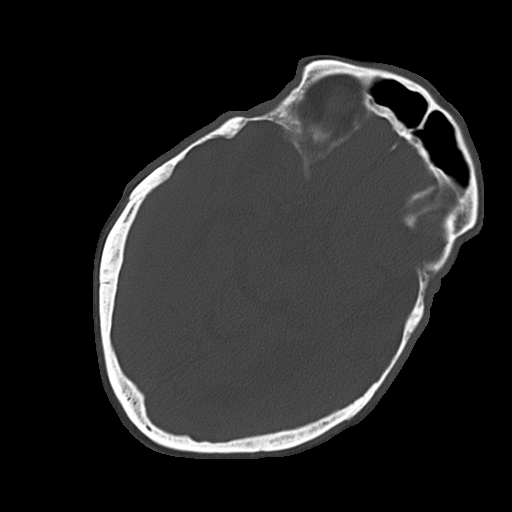
[im 33/50  bone]
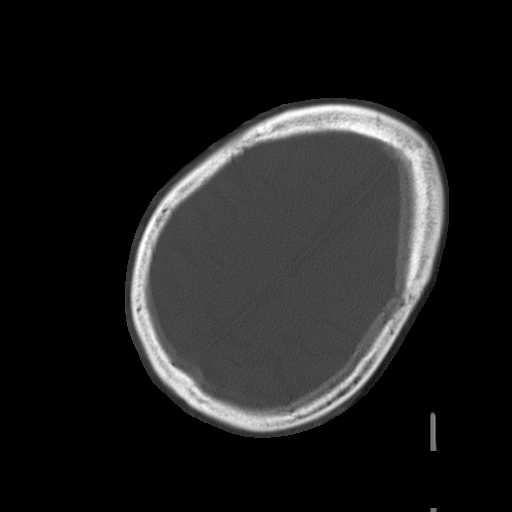

[10 of 30 positions shown; findings below may reference images not displayed]

FINDINGS: Exam detail is diminished due to motion artifact and
suboptimal patient positioning.  There is a hyperattenuating mass
overlying the left frontal cerebral convexity compatible with acute
hematoma.  This measures approximately 2.7 cm, image 14/series 5.
There is likely both cerebral dural and subarachnoid component to
this hematoma.  A second area of hemorrhage is involving the left
occipital lobe with both small subdural and subarachnoid
components.  Hyper attenuating focus within the occipital horn of
the left lateral ventricle is noted measuring 9 mm, image 6/series
5.  This is new from the previous MRI from 06/03/2006.

There is diffuse patchy low density throughout the subcortical and
periventricular white matter consistent with chronic small vessel
ischemic change.

There is prominence of the sulci and ventricles consistent with
brain atrophy.

There is no evidence for acute brain infarct, hemorrhage or mass.

The paranasal sinuses appear clear.  There is opacification of both
mastoid air cells.  The skull appears intact.  Within the occipital
horn
IMPRESSION: 1.  Exam detail is significantly diminished due to motion artifact
and patient positioning.
2.  There are least two areas of subarachnoid and subdural hematoma
involving the left frontal lobe and left occipital lobe.  Improved
definition may be obtained with repeat imaging without motion
artifact.
3.  Suspect intraventricular hemorrhage involving the occipital
horn of the left lateral ventricle.
4.  Small vessel ischemic change and brain atrophy.

CT CERVICAL SPINE
FINDINGS: Image detail is diminished due to motion artifact.  There
is an anterolisthesis of C4 on C5.  There is multilevel disc space
narrowing and ventral endplate spurring identified throughout the
cervical spine.  No fractures are identified.
IMPRESSION: 1.  Exam detail is diminished secondary to motion artifact.
2.  No fracture identified.
3.  Advanced cervical spondylosis noted.

Critical Value/emergent results were called by telephone at the
time of interpretation on 12/30/2012. at [DATE] p.m. to Dr. Bl[place]a,
who verbally acknowledged these results.

## 2015-07-01 IMAGING — CT CT HEAD W/O CM
1 series · 15 of 30 positions shown, 19 images · non-contrast
Comparison: Prior CT from 12/30/2012

CLINICAL DATA: Follow up subdural and subarachnoid hemorrhage

CT HEAD WITHOUT CONTRAST
TECHNIQUE: Contiguous axial images were obtained from the base of
the skull through the vertex without contrast.

[Series 2: head 5.0 h30s · axial · 0.47mm/px · z∈[-191,-36]mm · 15 of 35 slices shown, 19 images]
[im 2/35  brain]
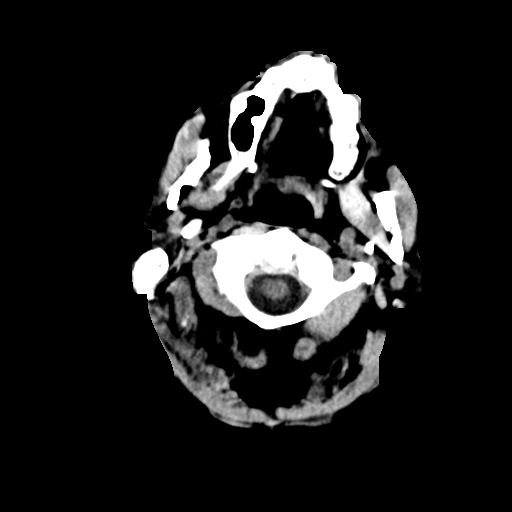
[im 2/35  bone]
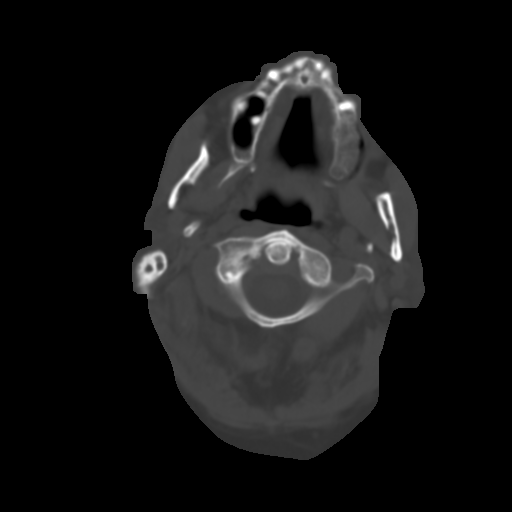
[im 4/35  brain]
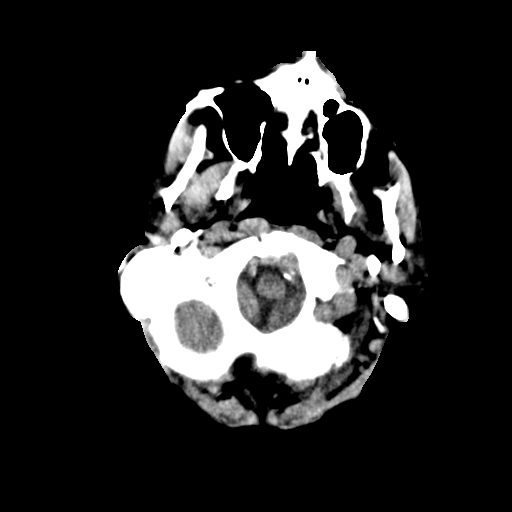
[im 6/35  brain]
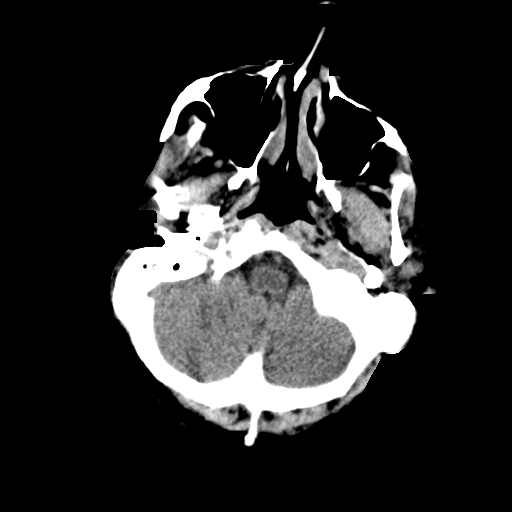
[im 9/35  brain]
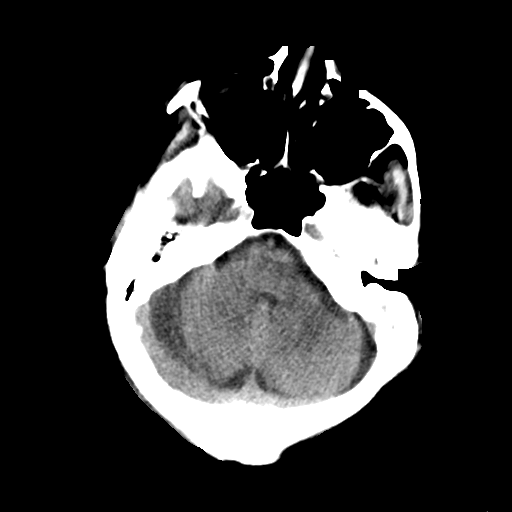
[im 11/35  brain]
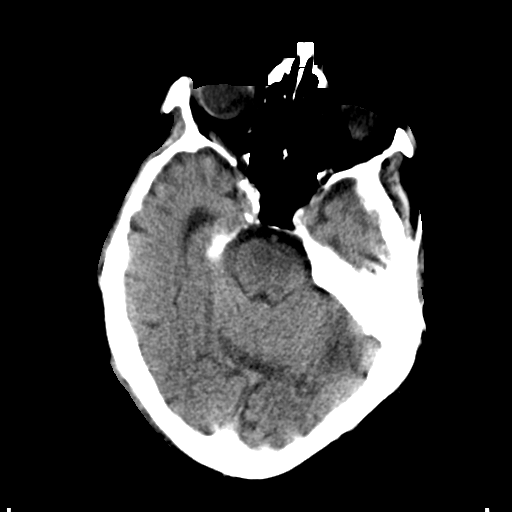
[im 11/35  bone]
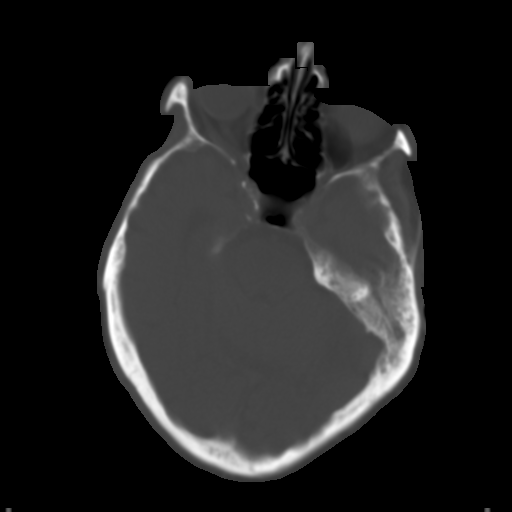
[im 13/35  brain]
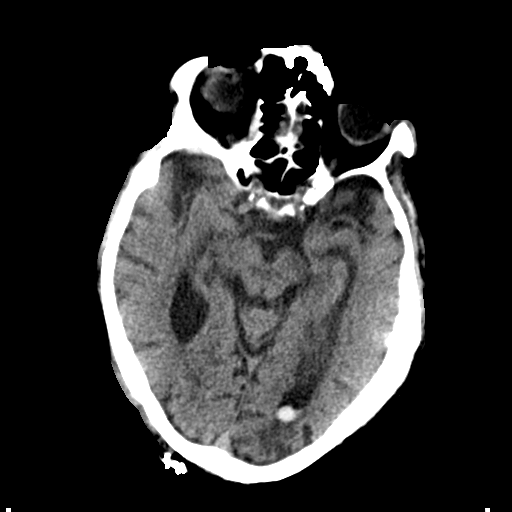
[im 16/35  brain]
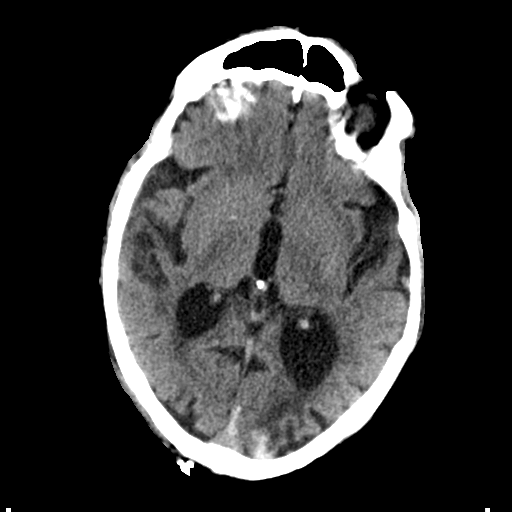
[im 18/35  brain]
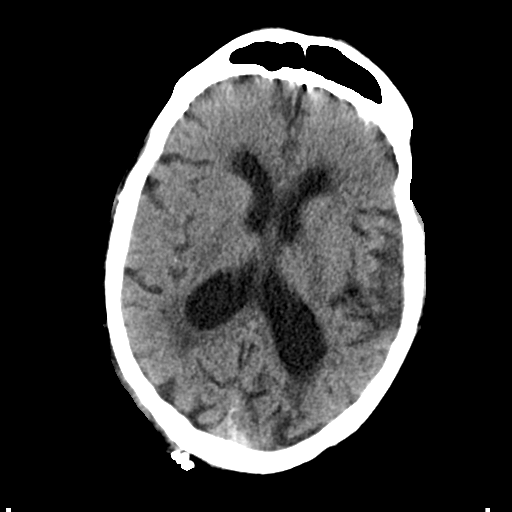
[im 19/35  brain]
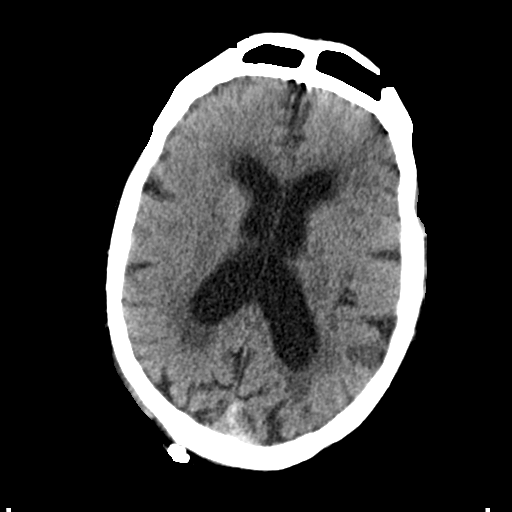
[im 19/35  bone]
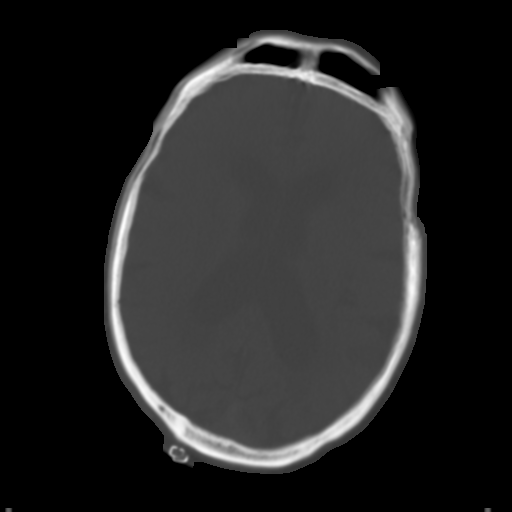
[im 22/35  brain]
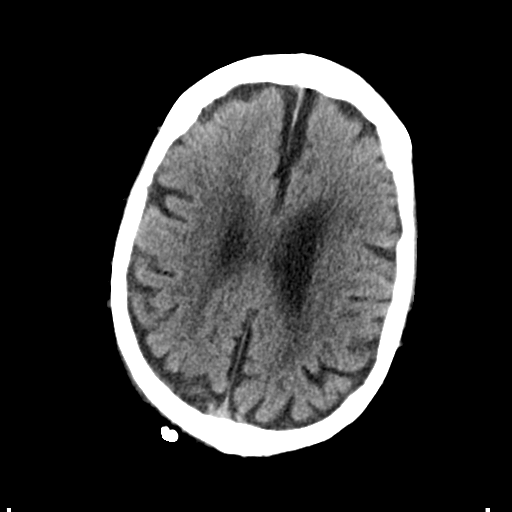
[im 24/35  brain]
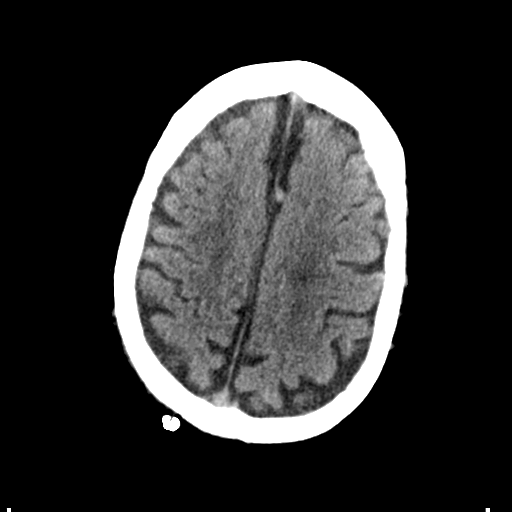
[im 26/35  brain]
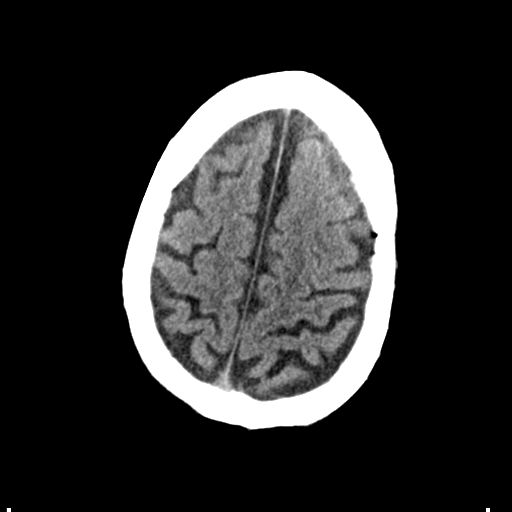
[im 29/35  brain]
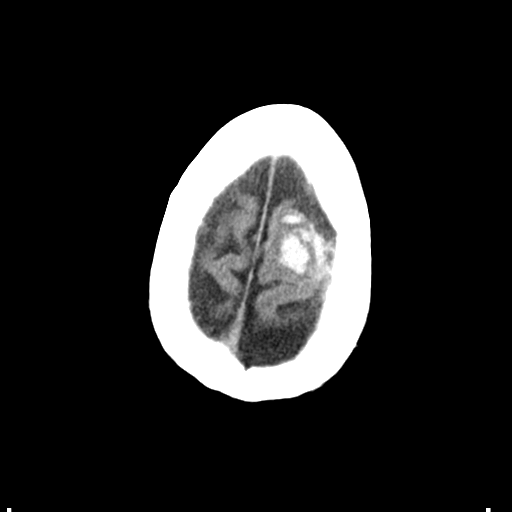
[im 29/35  bone]
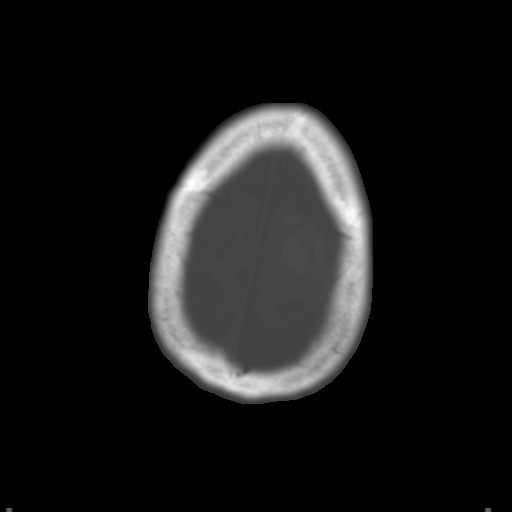
[im 31/35  brain]
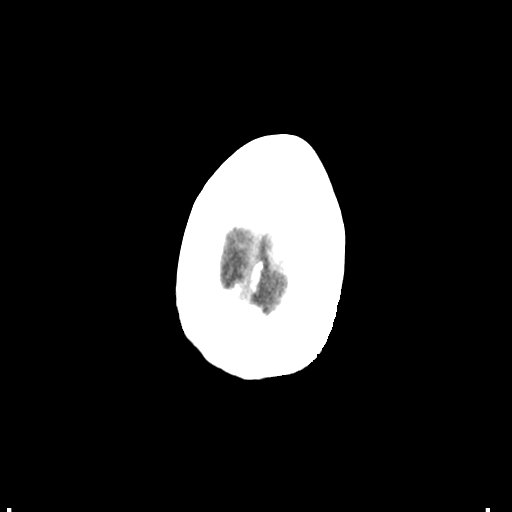
[im 33/35  brain]
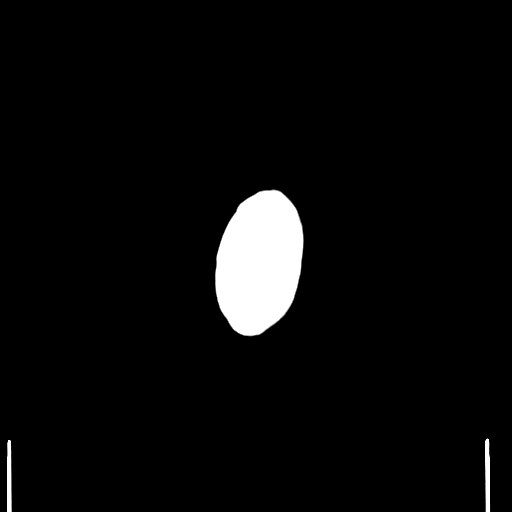

[15 of 30 positions shown; findings below may reference images not displayed]

FINDINGS: Hemorrhagic contusion within the left frontal lobe at the
left vertex is grossly similar in size, measuring 2.4 x 3.5 cm
(series 2, image 30).  No midline shift is seen at this level.
Additional parenchymal contusion and / or subarachnoid hemorrhage
within the left occipital lobe measures approximately 1.3 x 1.5 cm
(series 2, image 16), also grossly similar.  I hemorrhage within
the occipital horn of the left lateral ventricle is unchanged.
Overall ventricular size is not significantly changed. No new
hemorrhage identified.  Atrophy with chronic microvascular ischemic
changes again noted.

Soft tissue laceration overlying the occiput is again noted.  No
underlying skull fracture.

There are bilateral mastoid effusions, left greater than right.  No
definite temporal bone fracture identified.
IMPRESSION: 1. Grossly stable size of intraparenchymal/subarachnoid hemorrhages
involving the left frontal lobe near the vertex and left occipital
lobe.
2.  Stable size and appearance of blood within the occipital horn
the left lateral ventricle.  Overall ventricular size is not
significantly changed as compared to recent exams.
3.  No new hemorrhage identified.
4.  Bilateral mastoid effusions, left greater than right.  No
temporal bone fracture identified, although evaluation for this is
limited on this exam due to motion artifact. Correlation with
dedicated temporal bone imaging could be performed as clinically
indicated and as the patient will tolerate.
5.  Stable atrophy with chronic microvascular ischemic disease.
